# Patient Record
Sex: Male | Born: 1935 | Race: White | Hispanic: No | Smoking: Former smoker
Health system: Southern US, Community
[De-identification: ages and names within clinical notes are randomized; demographics above are authoritative.]

## PROBLEM LIST (undated history)

## (undated) DIAGNOSIS — F329 Major depressive disorder, single episode, unspecified: Secondary | ICD-10-CM

## (undated) DIAGNOSIS — E538 Deficiency of other specified B group vitamins: Secondary | ICD-10-CM

## (undated) DIAGNOSIS — I2510 Atherosclerotic heart disease of native coronary artery without angina pectoris: Secondary | ICD-10-CM

## (undated) DIAGNOSIS — F32A Depression, unspecified: Secondary | ICD-10-CM

## (undated) DIAGNOSIS — C801 Malignant (primary) neoplasm, unspecified: Secondary | ICD-10-CM

## (undated) DIAGNOSIS — M1990 Unspecified osteoarthritis, unspecified site: Secondary | ICD-10-CM

## (undated) HISTORY — PX: HERNIA REPAIR: SHX51

## (undated) HISTORY — PX: EYE SURGERY: SHX253

---

## 1998-03-16 HISTORY — PX: REVISION UROSTOMY CUTANEOUS: SUR1282

## 1998-10-07 ENCOUNTER — Encounter (INDEPENDENT_AMBULATORY_CARE_PROVIDER_SITE_OTHER): Payer: Self-pay

## 1998-10-07 ENCOUNTER — Encounter: Payer: Self-pay | Admitting: Urology

## 1998-10-07 ENCOUNTER — Observation Stay (HOSPITAL_COMMUNITY): Admission: RE | Admit: 1998-10-07 | Discharge: 1998-10-08 | Payer: Self-pay | Admitting: Urology

## 1998-11-04 ENCOUNTER — Encounter (INDEPENDENT_AMBULATORY_CARE_PROVIDER_SITE_OTHER): Payer: Self-pay | Admitting: Specialist

## 1998-11-04 ENCOUNTER — Inpatient Hospital Stay (HOSPITAL_COMMUNITY): Admission: RE | Admit: 1998-11-04 | Discharge: 1998-11-10 | Payer: Self-pay | Admitting: Urology

## 1999-01-08 ENCOUNTER — Encounter: Payer: Self-pay | Admitting: Urology

## 1999-01-08 ENCOUNTER — Encounter: Admission: RE | Admit: 1999-01-08 | Discharge: 1999-01-08 | Payer: Self-pay | Admitting: Urology

## 1999-07-02 ENCOUNTER — Encounter: Payer: Self-pay | Admitting: Urology

## 1999-07-02 ENCOUNTER — Encounter: Admission: RE | Admit: 1999-07-02 | Discharge: 1999-07-02 | Payer: Self-pay | Admitting: Urology

## 2000-10-15 ENCOUNTER — Encounter: Admission: RE | Admit: 2000-10-15 | Discharge: 2000-10-15 | Payer: Self-pay | Admitting: Urology

## 2000-10-15 ENCOUNTER — Encounter: Payer: Self-pay | Admitting: Urology

## 2000-10-18 ENCOUNTER — Encounter: Admission: RE | Admit: 2000-10-18 | Discharge: 2000-10-18 | Payer: Self-pay | Admitting: Urology

## 2000-10-18 ENCOUNTER — Encounter: Payer: Self-pay | Admitting: Urology

## 2001-10-14 ENCOUNTER — Encounter: Payer: Self-pay | Admitting: Urology

## 2001-10-14 ENCOUNTER — Encounter: Admission: RE | Admit: 2001-10-14 | Discharge: 2001-10-14 | Payer: Self-pay | Admitting: Urology

## 2001-10-20 ENCOUNTER — Encounter: Payer: Self-pay | Admitting: Urology

## 2001-10-20 ENCOUNTER — Encounter: Admission: RE | Admit: 2001-10-20 | Discharge: 2001-10-20 | Payer: Self-pay | Admitting: Urology

## 2002-05-08 ENCOUNTER — Encounter: Admission: RE | Admit: 2002-05-08 | Discharge: 2002-05-08 | Payer: Self-pay | Admitting: Urology

## 2002-05-08 ENCOUNTER — Encounter: Payer: Self-pay | Admitting: Urology

## 2002-05-10 ENCOUNTER — Ambulatory Visit (HOSPITAL_BASED_OUTPATIENT_CLINIC_OR_DEPARTMENT_OTHER): Admission: RE | Admit: 2002-05-10 | Discharge: 2002-05-10 | Payer: Self-pay | Admitting: Urology

## 2002-06-05 ENCOUNTER — Encounter (INDEPENDENT_AMBULATORY_CARE_PROVIDER_SITE_OTHER): Payer: Self-pay | Admitting: Specialist

## 2002-06-05 ENCOUNTER — Observation Stay (HOSPITAL_COMMUNITY): Admission: RE | Admit: 2002-06-05 | Discharge: 2002-06-06 | Payer: Self-pay | Admitting: Urology

## 2003-05-01 ENCOUNTER — Encounter: Admission: RE | Admit: 2003-05-01 | Discharge: 2003-05-01 | Payer: Self-pay | Admitting: Urology

## 2003-05-02 ENCOUNTER — Ambulatory Visit (HOSPITAL_BASED_OUTPATIENT_CLINIC_OR_DEPARTMENT_OTHER): Admission: RE | Admit: 2003-05-02 | Discharge: 2003-05-02 | Payer: Self-pay | Admitting: Urology

## 2003-05-02 ENCOUNTER — Encounter (INDEPENDENT_AMBULATORY_CARE_PROVIDER_SITE_OTHER): Payer: Self-pay | Admitting: *Deleted

## 2003-05-02 ENCOUNTER — Ambulatory Visit (HOSPITAL_COMMUNITY): Admission: RE | Admit: 2003-05-02 | Discharge: 2003-05-02 | Payer: Self-pay | Admitting: Urology

## 2003-05-21 ENCOUNTER — Encounter (INDEPENDENT_AMBULATORY_CARE_PROVIDER_SITE_OTHER): Payer: Self-pay | Admitting: Specialist

## 2003-05-21 ENCOUNTER — Ambulatory Visit (HOSPITAL_COMMUNITY): Admission: RE | Admit: 2003-05-21 | Discharge: 2003-05-21 | Payer: Self-pay | Admitting: Urology

## 2004-03-16 HISTORY — PX: KNEE ARTHROSCOPY: SHX127

## 2004-11-03 ENCOUNTER — Ambulatory Visit: Payer: Self-pay | Admitting: Gastroenterology

## 2007-12-01 ENCOUNTER — Ambulatory Visit: Payer: Self-pay | Admitting: Critical Care Medicine

## 2007-12-01 DIAGNOSIS — I2510 Atherosclerotic heart disease of native coronary artery without angina pectoris: Secondary | ICD-10-CM | POA: Insufficient documentation

## 2007-12-02 ENCOUNTER — Telehealth: Payer: Self-pay | Admitting: Critical Care Medicine

## 2007-12-04 DIAGNOSIS — E785 Hyperlipidemia, unspecified: Secondary | ICD-10-CM

## 2007-12-04 DIAGNOSIS — G4730 Sleep apnea, unspecified: Secondary | ICD-10-CM | POA: Insufficient documentation

## 2007-12-04 DIAGNOSIS — J449 Chronic obstructive pulmonary disease, unspecified: Secondary | ICD-10-CM

## 2007-12-04 DIAGNOSIS — J4489 Other specified chronic obstructive pulmonary disease: Secondary | ICD-10-CM | POA: Insufficient documentation

## 2007-12-08 ENCOUNTER — Ambulatory Visit: Payer: Self-pay | Admitting: Critical Care Medicine

## 2007-12-08 DIAGNOSIS — R0602 Shortness of breath: Secondary | ICD-10-CM

## 2007-12-09 ENCOUNTER — Encounter: Payer: Self-pay | Admitting: Critical Care Medicine

## 2008-01-02 ENCOUNTER — Ambulatory Visit: Payer: Self-pay | Admitting: Critical Care Medicine

## 2010-01-07 ENCOUNTER — Emergency Department: Payer: PRIVATE HEALTH INSURANCE | Admitting: Emergency Medicine

## 2010-03-16 HISTORY — PX: MOHS SURGERY: SUR867

## 2010-04-22 ENCOUNTER — Ambulatory Visit: Payer: PRIVATE HEALTH INSURANCE | Admitting: Sports Medicine

## 2010-08-01 NOTE — Op Note (Signed)
NAME:  Shawn Lyons, Shawn Lyons                         ACCOUNT NO.:  [id number]   MEDICAL RECORD NO.:  [id number]                   PATIENT TYPE:  AMB   LOCATION:  [facility]                                 FACILITY:  [facility]   PHYSICIAN:  Valetta Fuller, M.D.               DATE OF BIRTH:  06-Sep-1919   DATE OF PROCEDURE:  05/02/2003  DATE OF DISCHARGE:                                 OPERATIVE REPORT   PREOPERATIVE DIAGNOSES:  1. 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  2. Urethral discharge.   POSTOPERATIVE DIAGNOSIS:  1. 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  2. Urethral discharge.   PROCEDURE PERFORMED:  Urethroscopy with biopsy and fulguration.   SURGEON:  Valetta Fuller, M.D.   ANESTHESIA:  General.   INDICATION:  Shawn Lyons is an 77 year old male.  He had documentation of  muscle invasive bladder cancer approximately five years ago.  He had  nonmetastatic disease.  He subsequently underwent radical  cystoprostatectomy.  He did have residual carcinoma in situ of his bladder.  There was no evidence of advanced disease and he continued to do well  clinically he has had no complications or problems from surgery and follow  up imaging studies have failed to demonstrate recurrence or metastatic  disease.  Approximately a year ago he began noticing a little discomfort  within his penis.  At that time, we did washings from his urethra that  revealed some questionable transitional cell carcinoma.  On cystoscopy we  could see no definite tumor but the patient did undergo a complete  urethrectomy out to the glandular urethra and a small remnant of glandular  urethra was left.  That was approximately a year ago and the pathology  showed some dysplasia and possibly some transitional cell carcinoma in  situ,  but no definitive disease outside the urethra.  The patient recently has had  a little bloody spotting from his urethra, therefore presents now for  assessment of the last remaining distal stump of urethra.   TECHNIQUE:  The patient was brought to the operating room where he had  successful induction of general anesthesia.  He was placed in the lithotomy  position and prepped and draped in the usual manner. Endoscopy was performed  with the distal most 3-4 cm of urethra.  Right at the area of previous  resection, there was some abnormal-appearing mucosa.  It appeared to be  consistent with recurrent transitional cell carcinoma in this area.  It  appeared low grade and superficial.  Multiple cold cut biopsies were taken  and all the residual tumor was removed.  The area was then extensively  fulgurated with the Bugbee electrode.  The patient appeared to tolerate the  procedure well and there were  no obvious complications.                                              Valetta Fuller, M.D.   DSG/MEDQ  D:  05/02/2003  T:  05/02/2003  Job:  [id number]

## 2010-08-01 NOTE — Op Note (Signed)
NAME:  Shawn Lyons, Shawn Lyons                         ACCOUNT NO.:  [id number]   MEDICAL RECORD NO.:  [id number]                   PATIENT TYPE:  AMB   LOCATION:  DAY                                  FACILITY:  [facility]   PHYSICIAN:  Valetta Fuller, M.D.               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   POSTOPERATIVE DIAGNOSES:  Transitional cell carcinoma of the retained  urethra status post radical cystoprostatectomy.   PROCEDURE:  Urethrectomy.   SURGEON:  Valetta Fuller, M.D.   ASSISTANT:  Sigmund I. Patsi Sears, M.D.   ANESTHESIA:  General.   INDICATIONS FOR PROCEDURE:  Shawn Lyons is an 77 year old male. He  initially presented with multifocal transitional cell carcinoma of the  bladder showing both invasion as well as carcinoma in situ. He subsequently  had cystectomy with ileal urinary diversion about 3 1/2 years ago. His final  pathology revealed some superficial invasive adenocarcinoma of the bladder  as well as carcinoma in situ of the bladder and also some adenocarcinoma of  the prostate. Postoperatively, he has done extremely well. All imaging  studies have been negative. He recently complained of a little penile  irritation. Cytology washings of his urethra showed some malignant cells.  Cystoscopy did not reveal an obvious tumor and his CT was negative. 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 He  presents now for urethrectomy. We explained the procedure to him, the  rationale for this. He seems to understand this issue and full informed  consent was obtained.   TECHNIQUE:  The patient was brought to the operating room. He was placed in  the high lithotomy position after a successful induction of general  endotracheal  anesthesia and was prepped and draped in the usual manner. An  inverted Y incision was made in the perineum extending from the under  surface of the scrotum down around the anus. Superficial layers were incised  with electrocautery. With the sound and the urethra, we were able to  identify the more proximal urethra and the mobile spongiosa muscle was  incised in the midline. The urethra was encircled with a Penrose clamp. We  initiated our dissection distally separating the urethra from the corporal  bodies. We everted the penis and took out the urethra to the glans penis. We  did not feel in this situation it was really essential that we take out the  glandular section of the urethra and therefore the urethra was transected.  With the catheter in distally, we placed a clamp on the urethra and began  our dissection more proximally. The tissue planes were all normal until we  got to the very last aspect of the  urethra. There the tissues became quite  attenuated and with some very light traction on the urethra and some sharp  dissective technique, the entire urethra was removed. The wounds  were copiously irrigated. Small bleeders were handled with electrocautery.  All the tissue were reapproximated with some interrupted Vicryl suture and  the skin was closed with 4-0 Vicryl. A small Penrose drain was placed. The  patient appeared to tolerate the procedure well and there were no obvious  complications.                                                Valetta Fuller, M.D.    DSG/MEDQ  D:  06/05/2002  T:  06/05/2002  Job:  [id number]

## 2010-08-01 NOTE — Op Note (Signed)
NAME:  Shawn Lyons, Shawn Lyons                         ACCOUNT NO.:  [id number]   MEDICAL RECORD NO.:  [id number]                   PATIENT TYPE:  AMB   LOCATION:  [facility]                                 FACILITY:  [facility]   PHYSICIAN:  Valetta Fuller, M.D.               DATE OF BIRTH:  12-Feb-1920   DATE OF PROCEDURE:  05/10/2002  DATE OF DISCHARGE:                                 OPERATIVE REPORT   PREOPERATIVE DIAGNOSES:  1. History of transitional cell carcinoma of the bladder, status post     radical cystoprostatectomy and ileal loop urinary diversion.  2. Penile discomfort.  3. Positive urethral cytology for recurrent transitional cell carcinoma.   POSTOPERATIVE DIAGNOSES:  1. History of transitional cell carcinoma of the bladder, status post     radical cystoprostatectomy and ileal loop urinary diversion.  2. Penile discomfort.  3. Positive urethral cytology for recurrent transitional cell carcinoma.   PROCEDURE:  Cystoscopy and retrograde urethrogram.   SURGEON:  Valetta Fuller, M.D.   ANESTHESIA:  General.   INDICATIONS:  The patient is an 77 year old male.  The patient underwent a  radical cystoprostatectomy with urinary diversion a little over three years  ago.  He has done extremely well from surgery, and his pathology was  reasonably favorable.  He has had fairly regular CT scans, including  loopograms, and his last imaging in August of last year was normal.  He came  in recently for routine follow-up.  He complained of a little bit of penile  irritation and for that reason we did a urethral washing, which showed  evidence of high-grade urothelial cell carcinoma.  We felt it prudent to  perform cystoscopy and a retrograde urethrogram to get a better assessment  of the extent of this potential problem.  Because of the patient's  discomfort and fairly significant amount of pain with the cytology in the  office, we decided to go ahead and do this under anesthesia.   DESCRIPTION OF PROCEDURE:  The patient was brought to the operating room,  where he had successful induction of general anesthesia.  He was prepped and  draped in the usual manner.  Cystoscopy revealed a completely unremarkable  urethra.  As we get down to the area of the bulbar/membranous urethra, the  urethra became obliterated but I saw no evidence of obvious tumor within the  proximal urethra.  There was no really unusual inflammation.  We really  could not rule out the possibility of severe dysplasia or carcinoma in situ,  although endoscopically the mucosa looked relatively unremarkable.  Retrograde urethrogram also failed to show any filling defects.  At the  completion of the procedure, the patient was brought to the recovery room in  stable condition.  Our  plan will be to perform a pelvic CT scan.  Given his cytology, I do think it  is going to be necessary to do a  urethrectomy despite the relatively  unremarkable cystoscopy today.  We certainly want to get a CT first to be  sure that there is not more bulky disease or an obvious pelvic recurrence.                                               Valetta Fuller, M.D.    DSG/MEDQ  D:  05/10/2002  T:  05/10/2002  Job:  [id number]

## 2010-08-01 NOTE — H&P (Signed)
NAME:  Shawn Lyons, Shawn Lyons                         ACCOUNT NO.:  [id number]   MEDICAL RECORD NO.:  [id number]                   PATIENT TYPE:  AMB   LOCATION:  DAY                                  FACILITY:  [facility]   PHYSICIAN:  Valetta Fuller, M.D.               DATE OF BIRTH:  1919-07-04   DATE OF ADMISSION:  06/05/2002  DATE OF DISCHARGE:                                HISTORY & PHYSICAL   PREOPERATIVE DIAGNOSIS:  Transitional cell carcinoma of the urethra.   HISTORY OF PRESENT ILLNESS:  The patient is an 77 year old male.  He  underwent cystectomy in August 2000.  Prior to cystectomy, he had had a  pathology which had shown multiple areas of transitional cell carcinoma of  the bladder.  There was no obvious tumor within the prostatic urethra or at  the bladder neck.  He had poorly differentiated invasive transitional cell  carcinoma.  He also had carcinoma in situ.  At the time of his cystectomy he  was found to actually have some adenocarcinoma of his bladder.  He also had  some residual carcinoma in situ.  There was no muscle invasive disease noted  at that time of his cystectomy.  Again, he did have carcinoma in situ and he  also was noted incidentally to have prostatic adenocarcinoma.  The patient  did well from his surgery and has done extremely well over the last several  years.  He recently came in with some complaints of some nonspecific penile  discomfort.  We did a urethral washing which showed a high-grade  transitional cell carcinoma.  Cystoscopy did not show an obvious tumor.  CT  of the pelvis with contrast did not show evidence of obvious recurrent  tumor.  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  For that reason, I have recommended a completion urethrectomy.  The patient  presents now for that procedure and will be admitted for routine  postoperative care.   PAST MEDICAL HISTORY:  Past medical history is primarily significant for his  history of bladder cancer.  The patient's past medical history is mostly  notable for some gastroesophageal reflux for which he takes Prevacid.  He is  also on Zocor.  He has been on aspirin and Plavix as well.  He has had an MI  and had a cardiac cath in the past.  He does have a history of cardiac  stent.  He has no drug allergies.   SOCIAL HISTORY:  He is a social drinker.  He has a minimal tobacco use  history but quit in the past.   PAST SURGICAL HISTORY:  Surgical history is also notable for inguinal hernia  repair.   PHYSICAL EXAMINATION:  GENERAL:  He is a well-developed, well-nourished  male.  He appears somewhat younger than his stated age of 66.  VITAL SIGNS:  He is afebrile with a  heart rate of 80 and blood pressure  120/66.  CHEST:  Clear to auscultation.  ABDOMEN:  Abdomen is soft.  He has a healthy-appearing stoma in the right  lower quadrant.  GU:  External genitalia shows a normal penis with no abnormalities of the  scrotum, testes, or adnexal structures.  EXTREMITIES:  No edema.   DATA:  Labs are notable for a hemoglobin of 12.6, his potassium is slightly  elevated at 5.2.  His BUN and creatinine are 20 and 1.0, respectively.   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    PLAN:  The patient will have a completion urethrectomy today and will be  admitted hopefully for routine postoperative care.                                               Valetta Fuller, M.D.    DSG/MEDQ  D:  06/05/2002  T:  06/05/2002  Job:  [id number]

## 2010-08-01 NOTE — Op Note (Signed)
NAME:  Shawn Lyons, Shawn Lyons                         ACCOUNT NO.:  [id number]   MEDICAL RECORD NO.:  [id number]                   PATIENT TYPE:  OBV   LOCATION:  8295                                 FACILITY:  [facility]   PHYSICIAN:  Valetta Fuller, M.D.               DATE OF BIRTH:  [birth year]   DATE OF PROCEDURE:  05/21/2003  DATE OF DISCHARGE:                                 OPERATIVE REPORT   PREOPERATIVE DIAGNOSIS:  1. History of transitional cell carcinoma of the urinary bladder and     urethra.  2. Status post radical cystoprostatectomy with ileal loop urinary diversion     and partial urethrectomy.  3. Urethral carcinoma recurrence, distal urethra.   POSTOPERATIVE DIAGNOSIS:  1. History of transitional cell carcinoma of the urinary bladder and     urethra.  2. Status post radical cystoprostatectomy with ileal loop urinary diversion     and partial urethrectomy.  3. Urethral carcinoma recurrence, distal urethra.   PROCEDURE PERFORMED:  Penectomy.   SURGEON:  Valetta Fuller, M.D.   ANESTHESIA:  General.   INDICATIONS:  Shawn Lyons is an 77 year old male.  He has a complex  urologic history.  The patient was originally diagnosed with multifocal  transitional cell carcinoma of the urinary bladder approximately six years  ago.  He was initially under the care of a urologist in [place].  He had  several recurrences.  We saw him for a second opinion and recommended  initiation of intravesical therapy.  He failed that with recurrent  transitional cell carcinoma in situ in his bladder.  He subsequently  underwent radical cystoprostatectomy with ileal loop urinary diversion.  He  was noted to incidentally have adenocarcinoma of the prostate.  While he had  extensive disease in his bladder, he had no residual muscle or invasive  disease.  I felt he had an excellent chance of cure.  He did extremely well  for four years without evidence of recurrence.  Eventual urethral washings  showed positive cytology in his urethra.  His urethra was resected with a  small amount left distally within the glans urethra.  Pathology revealed  just dysplasia and no evidence of invasive disease.  The patient recently  had some bloody discharge from his urethra, and followup showed a recurrence  in the most distal aspect of the remaining urethra.  This tumor was high  grade and thought to be invasive.  In addition, the patient had some penile  discomfort, and we were concerned there probably was disease outside of the  urethra proper.  Patient underwent extensive counseling.  We discussed his  case in multidisciplinary oncology conference with the idea of potentially  offering either radiation or potential penectomy for this distal urethral  recurrence.  We felt that just removal of the remaining small stumpy urethra  would probably be inadequate.  After a month's discussion, the patient has  elected  to have a partial penectomy.  He has not been sexually active for  over eight years, and certainly, of course, does not void through his penis  because of his urinary diversion.  He presents now for this procedure.   TECHNIQUE/FINDINGS:  Patient was brought to the operating room where he had  successful induction of general anesthesia.  He was prepped and draped in  the usual manner.  We went ahead with a circumferential skin incision around  the base of the penis.  That skin was then degloved off the overlying Buck's  fascia of the penis.  Once we were able to expose the base/root of the  penis, we placed a tourniquet around the corporal bodies.  We knew that the  recurrence was in the stump of the urethra that was remaining.  For that  reason, I placed a red Robinson catheter through the glans penis, and it  went back about 3 cm, indicating the area where there was still some  residual urethra.  We went at least 2 cm proximal to that and transected the  corpora.  As we transected the  corpora, some stay sutures of 2-0 Vicryl were  placed.  There was no residual urethral tissue on this area, and all of the  residual urethral tissue was proximal within the specimen.  The corporal  bodies were then over-sewn with several running 2-0 Vicryl sutures.  The  skin was then closed with some interrupted 4-0 Vicryl sutures.  The patient  appeared to tolerate the procedure well.  There were no obvious  complications or problems.  He was brought to the recovery room in  satisfactory condition.  Sponge and needle counts were correct.                                               Valetta Fuller, M.D.    DSG/MEDQ  D:  05/21/2003  T:  05/21/2003  Job:  [id number]

## 2011-01-25 ENCOUNTER — Emergency Department: Payer: PRIVATE HEALTH INSURANCE | Admitting: Emergency Medicine

## 2011-09-05 LAB — COMPREHENSIVE METABOLIC PANEL
Albumin: 3.8 g/dL (ref 3.4–5.0)
Alkaline Phosphatase: 109 U/L (ref 50–136)
Anion Gap: 13 (ref 7–16)
Bilirubin,Total: 0.6 mg/dL (ref 0.2–1.0)
Chloride: 102 mmol/L (ref 98–107)
Creatinine: 0.89 mg/dL (ref 0.60–1.30)
EGFR (African American): >60
Glucose: 128 mg/dL — ABNORMAL HIGH (ref 65–99)
SGPT (ALT): 18 U/L
Total Protein: 7.2 g/dL (ref 6.4–8.2)

## 2011-09-05 LAB — CBC WITH DIFFERENTIAL/PLATELET
Basophil #: 0 10*3/uL (ref 0.0–0.1)
Basophil %: 0.3 %
Eosinophil #: 0 10*3/uL (ref 0.0–0.7)
Eosinophil %: 0.3 %
HCT: 38.3 % — ABNORMAL LOW (ref 40.0–52.0)
HGB: 12.9 g/dL — ABNORMAL LOW (ref 13.0–18.0)
Lymphocyte #: 0.6 10*3/uL — ABNORMAL LOW (ref 1.0–3.6)
Lymphocyte %: 6.5 %
MCH: 30.2 pg (ref 26.0–34.0)
MCHC: 33.6 g/dL (ref 32.0–36.0)
MCV: 90 fL (ref 80–100)
Monocyte #: 0.4 x10 3/mm (ref 0.2–1.0)
Monocyte %: 4.8 %
Neutrophil #: 8.2 10*3/uL — ABNORMAL HIGH (ref 1.4–6.5)
Neutrophil %: 88.1 %
Platelet: 173 10*3/uL (ref 150–440)
RBC: 4.27 10*6/uL — ABNORMAL LOW (ref 4.40–5.90)
RDW: 14.2 % (ref 11.5–14.5)
WBC: 9.3 10*3/uL (ref 3.8–10.6)

## 2011-09-05 LAB — URINALYSIS, COMPLETE
Bacteria: NONE SEEN
Bilirubin,UR: NEGATIVE
Glucose,UR: NEGATIVE mg/dL (ref 0–75)
Leukocyte Esterase: NEGATIVE
Nitrite: NEGATIVE
Protein: 30
RBC,UR: 42 /HPF (ref 0–5)
Specific Gravity: 1.016 (ref 1.003–1.030)
WBC UR: 43 /HPF (ref 0–5)

## 2011-09-05 LAB — LIPASE, BLOOD: Lipase: 72 U/L — ABNORMAL LOW (ref 73–393)

## 2011-09-05 LAB — AMYLASE: Amylase: 62 U/L (ref 25–115)

## 2011-09-06 ENCOUNTER — Inpatient Hospital Stay: Payer: Self-pay | Admitting: Surgery

## 2011-09-06 LAB — TROPONIN I: Troponin-I: <0.02 ng/mL

## 2011-09-07 LAB — CBC WITH DIFFERENTIAL/PLATELET
Basophil #: 0 10*3/uL (ref 0.0–0.1)
Basophil %: 0.5 %
Eosinophil #: 0.1 10*3/uL (ref 0.0–0.7)
HCT: 33.3 % — ABNORMAL LOW (ref 40.0–52.0)
Lymphocyte #: 1 10*3/uL (ref 1.0–3.6)
Lymphocyte %: 26.9 %
MCH: 30.4 pg (ref 26.0–34.0)
MCHC: 34.3 g/dL (ref 32.0–36.0)
MCV: 89 fL (ref 80–100)
Monocyte %: 12 %
Neutrophil #: 2.2 10*3/uL (ref 1.4–6.5)
Platelet: 163 10*3/uL (ref 150–440)
RDW: 14.6 % — ABNORMAL HIGH (ref 11.5–14.5)

## 2011-09-07 LAB — BASIC METABOLIC PANEL
Anion Gap: 7 (ref 7–16)
BUN: 10 mg/dL (ref 7–18)
EGFR (African American): >60
EGFR (Non-African Amer.): >60
Osmolality: 282 (ref 275–301)
Potassium: 3.7 mmol/L (ref 3.5–5.1)

## 2011-09-07 LAB — URINE CULTURE

## 2011-10-07 ENCOUNTER — Ambulatory Visit: Payer: Self-pay | Admitting: Surgery

## 2011-10-07 DIAGNOSIS — I2510 Atherosclerotic heart disease of native coronary artery without angina pectoris: Secondary | ICD-10-CM

## 2011-10-17 LAB — CREATININE, SERUM
Creatinine: 0.78 mg/dL (ref 0.60–1.30)
EGFR (African American): >60
EGFR (Non-African Amer.): >60

## 2011-10-18 ENCOUNTER — Inpatient Hospital Stay: Payer: Self-pay | Admitting: Surgery

## 2012-03-12 ENCOUNTER — Ambulatory Visit: Payer: Self-pay | Admitting: Orthopedic Surgery

## 2012-11-19 ENCOUNTER — Observation Stay: Payer: Self-pay | Admitting: Internal Medicine

## 2012-11-19 LAB — COMPREHENSIVE METABOLIC PANEL
Albumin: 3.7 g/dL (ref 3.4–5.0)
Alkaline Phosphatase: 100 U/L (ref 50–136)
BUN: 23 mg/dL — ABNORMAL HIGH (ref 7–18)
Bilirubin,Total: 0.3 mg/dL (ref 0.2–1.0)
Chloride: 109 mmol/L — ABNORMAL HIGH (ref 98–107)
Co2: 28 mmol/L (ref 21–32)
Creatinine: 0.9 mg/dL (ref 0.60–1.30)
EGFR (African American): >60
EGFR (Non-African Amer.): >60
SGOT(AST): 23 U/L (ref 15–37)
SGPT (ALT): 21 U/L (ref 12–78)
Total Protein: 6.5 g/dL (ref 6.4–8.2)

## 2012-11-19 LAB — CBC
HCT: 34.2 % — ABNORMAL LOW (ref 40.0–52.0)
MCHC: 34 g/dL (ref 32.0–36.0)
MCV: 87 fL (ref 80–100)
RBC: 3.95 10*6/uL — ABNORMAL LOW (ref 4.40–5.90)

## 2012-11-19 LAB — TROPONIN I: Troponin-I: <0.02 ng/mL

## 2012-11-19 LAB — PROTIME-INR: INR: 1.2

## 2012-11-19 LAB — PRO B NATRIURETIC PEPTIDE: B-Type Natriuretic Peptide: 290 pg/mL (ref 0–450)

## 2012-11-19 LAB — MAGNESIUM: Magnesium: 1.9 mg/dL

## 2012-11-20 LAB — BASIC METABOLIC PANEL
Anion Gap: 7 (ref 7–16)
Calcium, Total: 8.9 mg/dL (ref 8.5–10.1)
Chloride: 109 mmol/L — ABNORMAL HIGH (ref 98–107)
Creatinine: 0.79 mg/dL (ref 0.60–1.30)
EGFR (Non-African Amer.): >60
Glucose: 109 mg/dL — ABNORMAL HIGH (ref 65–99)
Osmolality: 286 (ref 275–301)
Sodium: 142 mmol/L (ref 136–145)

## 2012-11-20 LAB — LIPID PANEL
Cholesterol: 117 mg/dL (ref 0–200)
HDL Cholesterol: 42 mg/dL (ref 40–60)
Triglycerides: 117 mg/dL (ref 0–200)

## 2012-11-20 LAB — TSH: Thyroid Stimulating Horm: 3.5 u[IU]/mL

## 2012-11-20 LAB — HEMOGLOBIN A1C: Hemoglobin A1C: 5.6 % (ref 4.2–6.3)

## 2013-03-01 ENCOUNTER — Ambulatory Visit: Payer: Self-pay | Admitting: Ophthalmology

## 2013-03-29 ENCOUNTER — Ambulatory Visit: Payer: Self-pay | Admitting: Ophthalmology

## 2014-04-12 IMAGING — CT CT ABD-PELV W/ CM
1 of 3 series · 13 of 32 positions shown, 19 images · non-contrast
Comparison: none

REASON FOR EXAM: (1) LLQ tendernss; (2) LLQ tendernss iwth nausea
COMMENTS:

[Series 5: delay soft tissue · axial · delayed · 0.68mm/px · z∈[-1212,-814]mm · 13 of 157 slices shown, 19 images]
[im 12/157  soft-tissue]
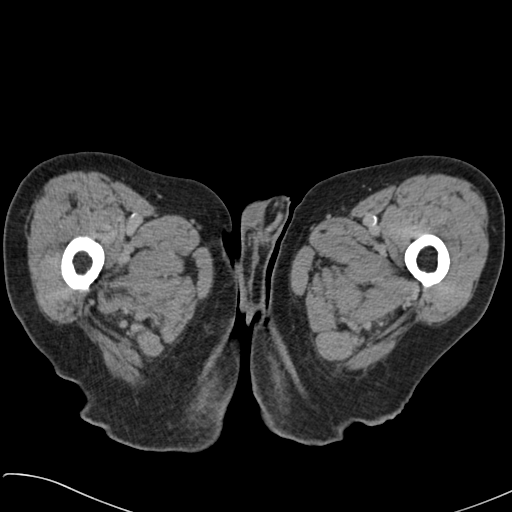
[im 12/157  bone]
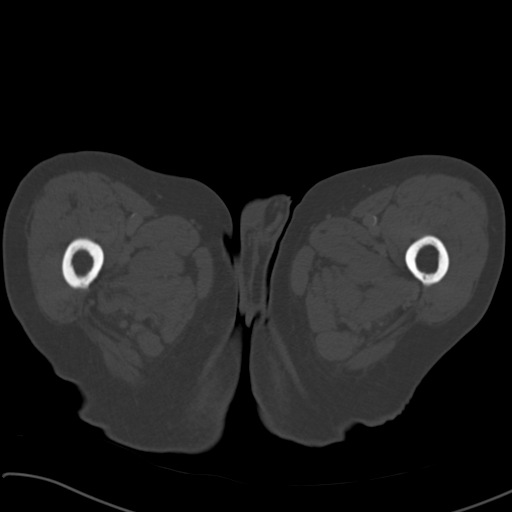
[im 23/157  soft-tissue]
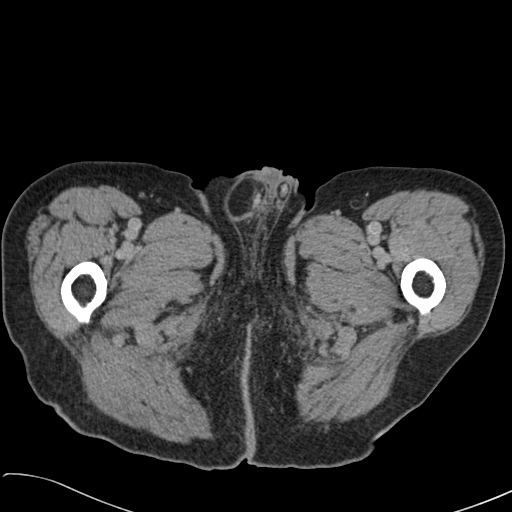
[im 34/157  soft-tissue]
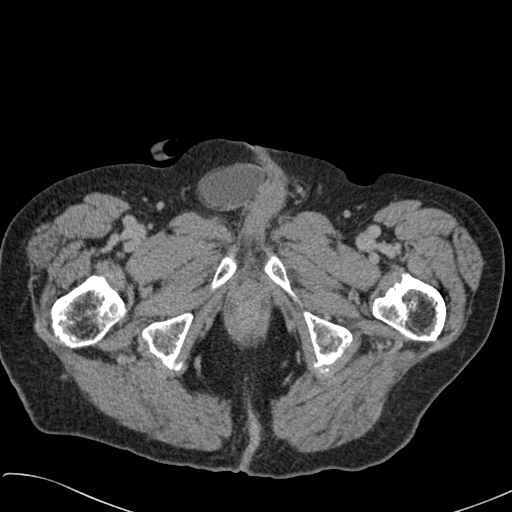
[im 45/157  soft-tissue]
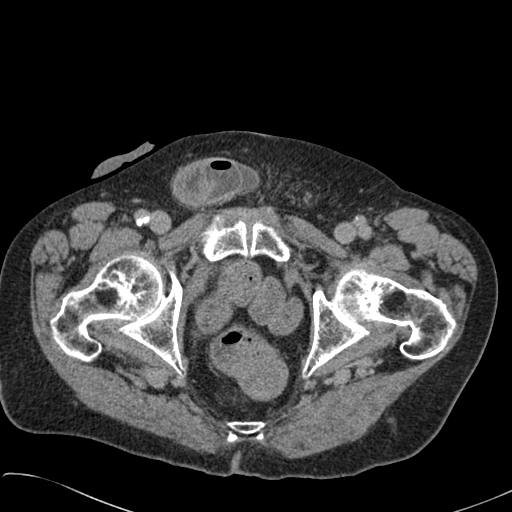
[im 56/157  soft-tissue]
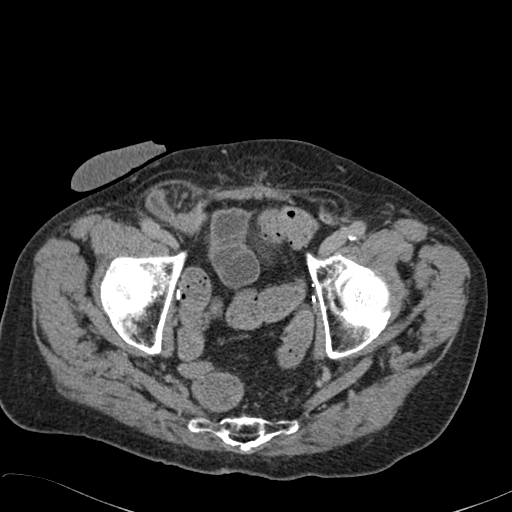
[im 67/157  soft-tissue]
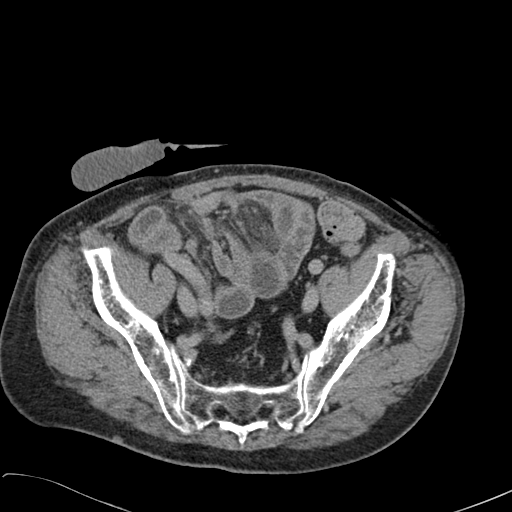
[im 79/157  soft-tissue]
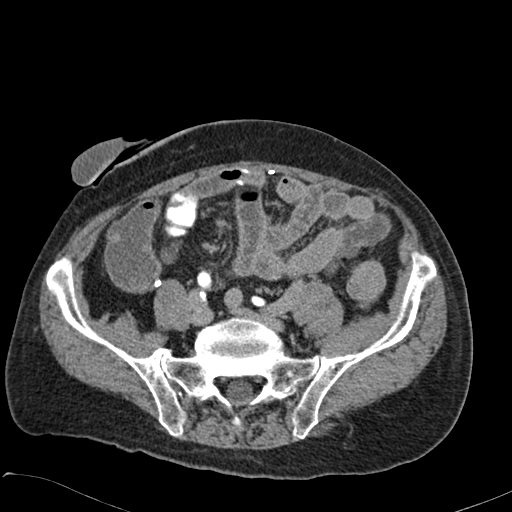
[im 90/157  soft-tissue]
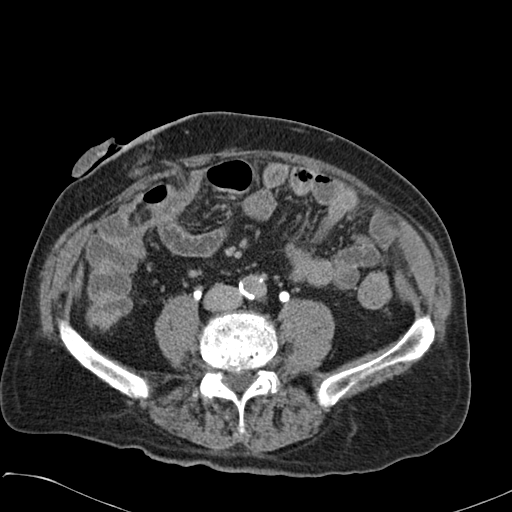
[im 101/157  soft-tissue]
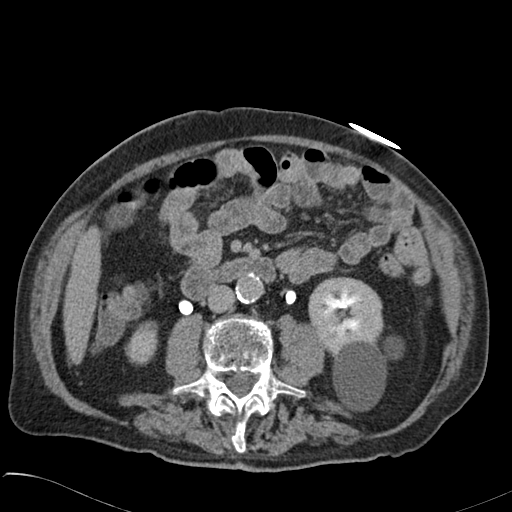
[im 101/157  bone]
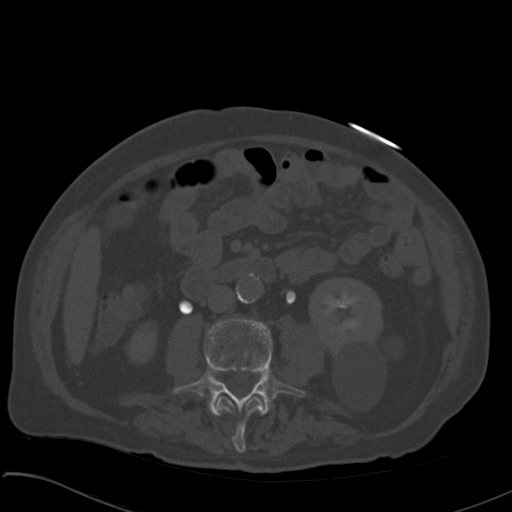
[im 112/157  soft-tissue]
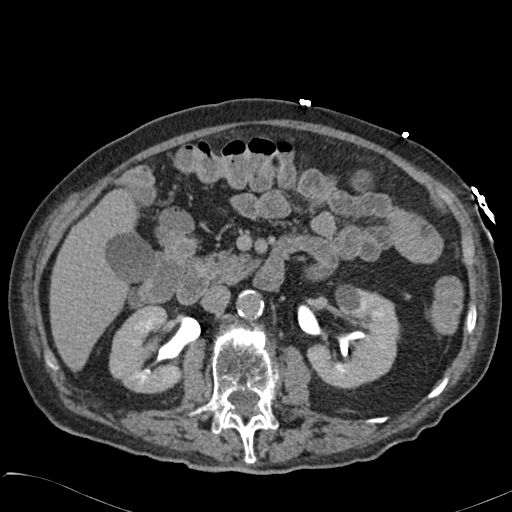
[im 112/157  lung]
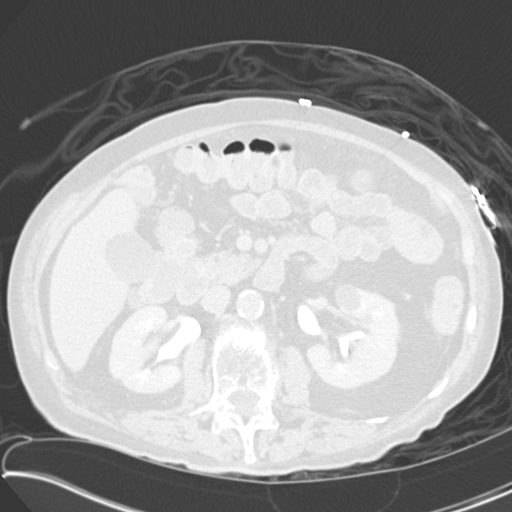
[im 123/157  soft-tissue]
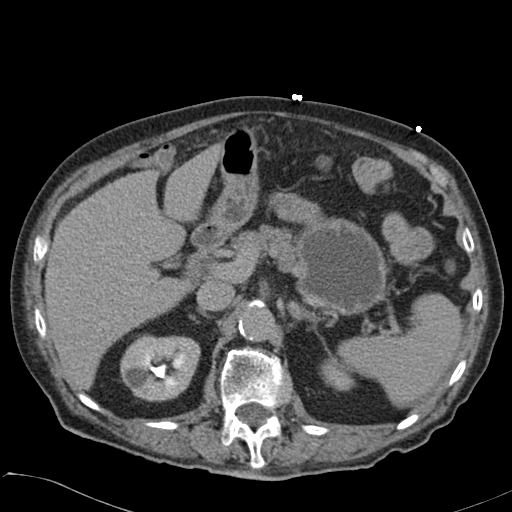
[im 123/157  lung]
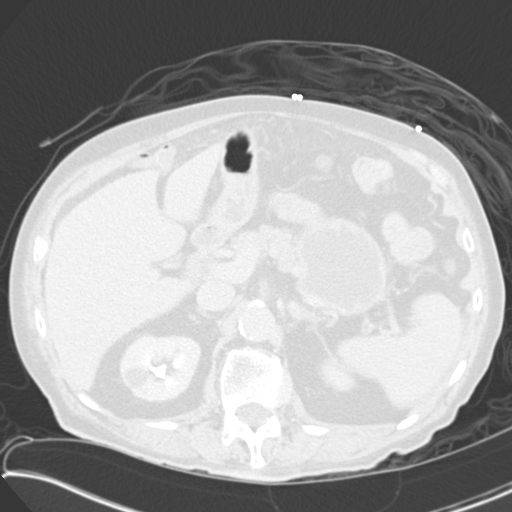
[im 134/157  soft-tissue]
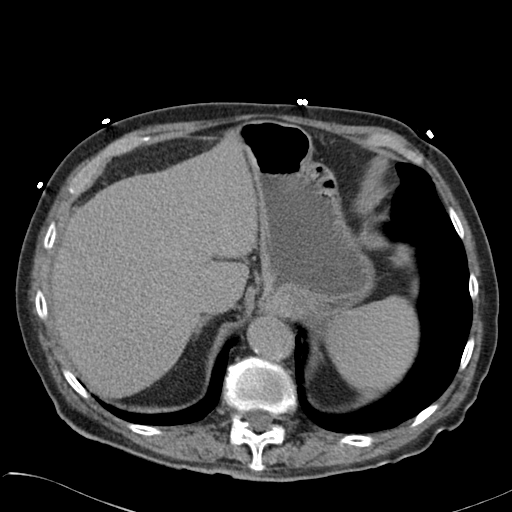
[im 134/157  lung]
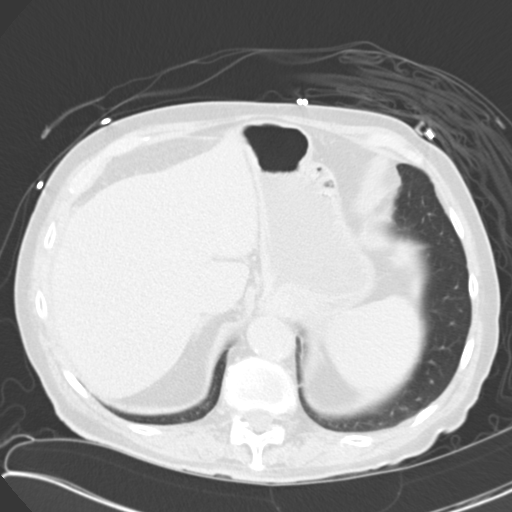
[im 145/157  soft-tissue]
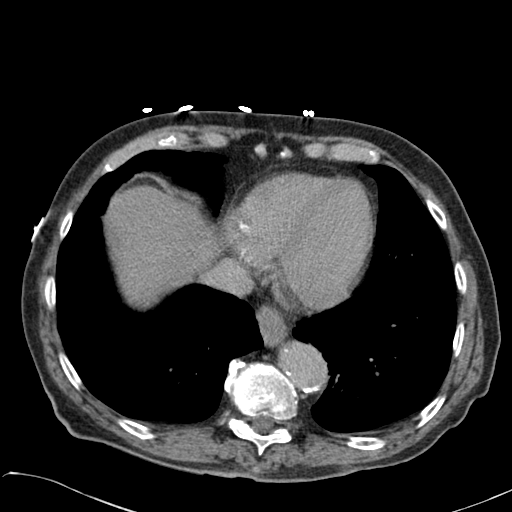
[im 145/157  lung]
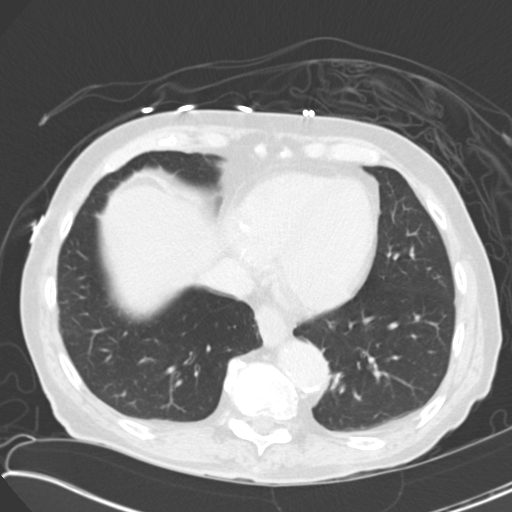

[13 of 32 positions shown; findings below may reference images not displayed]

PROCEDURE:     CT  - CT ABDOMEN / PELVIS  W  - September 05, 2011 [DATE]

RESULT:     CT of the abdomen and pelvis is performed with 100 mL of
Lsovue-U33 iodinated intravenous contrast. No oral contrast was utilized.
There is no previous exam for comparison.

The there is a loop of small bowel herniated through the inguinal canal into
the right scrotum. Mildly distended loops of small bowel are present. There
is moderate fluid distention of the stomach. The colon appears within normal
limits. Surgical consultation is recommended. Right-sided ostomy is present.
An ileal conduit appears present. Bilateral renal cysts are present. The
aorta is normal in caliber containing atherosclerotic calcification. There
is no ascites or abscess evident. The appendix is not identified.There is no
hydronephrosis or hydroureter evident. No cholelithiasis is evident. The
liver, spleen pancreas and aorta appear within normal limits except for
atherosclerotic disease.
IMPRESSION: 1. Herniation of loop of small bowel into the right inguinal canal down to
the right scrotum with slight prominence of loops of small bowel. Partial
obstruction should be considered. Surgical correlation is recommended. Ileal
conduit with an ostomy in the right abdominal wall. Atherosclerotic disease
present. Bilateral renal cysts present without obstruction. The lung bases
appear clear.

[REDACTED]

## 2014-07-03 NOTE — Op Note (Signed)
PATIENT NAME:  NEFI, MUSICH MR#:  [id number] DATE OF BIRTH:  03/02/1920  DATE OF PROCEDURE:  10/16/2011  PREOPERATIVE DIAGNOSIS: Right inguinal hernia.   POSTOPERATIVE DIAGNOSIS: Right inguinal hernia.   OPERATION: Right inguinal hernia repair.   ANESTHESIA: General.   SURGEON: Rodena Goldmann, MD  PROCEDURE: With the patient in the supine position after the induction of appropriate general anesthesia, the patient's right groin was prepped with ChloraPrep and draped with sterile towels. A parallel incision to the inguinal ligament was performed without difficulty and carried down through the subcutaneous tissue and subcutaneous fascia using Bovie electrocautery. The external oblique fascia opened through the external ring, exposing the cord structures and the sac. The cord structures were dissected off the inguinal floor and captured with a Penrose drain. The sac was identified and dissected free from the cord structures and the testicle with a large lipoma back to the internal ring. It was opened and there was no bowel in the sac. The sac was then suture ligated with 3-0 Vicryl, the sac divided and allowed to retract. The inguinal floor was then reinforced using 0 Surgilon adjoining the conjoined tendon to Poupart's ligament. The area was infiltrated with 0.5% Marcaine for postoperative pain control. 20 mL were utilized. Cord contents were returned to their anatomic position and the external oblique was closed over the cord using 3-0 Vicryl in interrupted fashion. The Scarpa's fascia was closed in a similar manner and then the skin clipped. The wounds were dressed sterilely and he was returned to the recovery room having tolerated the procedure well. Sponge, instrument, and needle counts were correct times two in the operating room.    ____________________________ Micheline Maze, MD rle:bjt D:  10/16/2011 12:22:29 ET         T: 10/16/2011 13:10:51 ET        JOB#: [id number]  cc: Ocie Cornfield.  Ouida Sills, [place] MD ELECTRONICALLY SIGNED 10/16/2011 18:42

## 2014-07-03 NOTE — Discharge Summary (Signed)
PATIENT NAME:  Shawn Lyons, Shawn Lyons MR#:  [id number] DATE OF BIRTH:  Dec 12, 1919  DATE OF ADMISSION:  10/16/2011 DATE OF DISCHARGE:  10/19/2011  ATTENDING PHYSICIAN: Dia Crawford, MD  DICTATING PHYSICIAN: Chauncey Reading, MD  DISCHARGE DIAGNOSES:  1. Right inguinal hernia, reducible. 2. History of bladder and prostate cancer, status post excessive resection requiring removal of penis and creation of urostomy. 3. Cardiac disease status post myocardial infarction with percutaneous intervention in 1990s.   PROCEDURES PERFORMED: Open right inguinal hernia repair with mesh on 10/16/2011.  HISTORY OF PRESENT ILLNESS/HOSPITAL COURSE: Shawn Lyons has a long complicated medical history requiring multiple surgeries for bladder cancer including urostomy who presented with right groin pain associated with a hernia last month.  At that time it was reduced, he was observed to ensure that we did not reduce gangrenous bowel and he was brought later back for elective surgery.  He had a relatively uncomplicated postoperative course.  His diet was slowly advanced and he was progressively advanced to PO pain medications. At the time of discharge, his pain was controlled with p.o. pain medications and he was tolerating a regular diet.  He had no difficulty with voiding or stooling.  His incision was c/d/i with staples at discharge.    DISCHARGE MEDICATIONS: 1. Aspirin 81 mg p.o. daily. 2. Remeron 30 mg by mouth daily. 3. Zocor 40 mg p.o. daily. 4. MiraLax one dose daily. 5. Pepcid 20 mg p.o. daily. 6. Unisom 25 mg p.o. at bedtime. 7. Fish oil 2,000 mg every a.m. and 1000 mg every p.m. 8. Glucosamine chondroitin with MSN oral tablet one tablet p.o. twice a day.  9. Vitamin D 5,000 units p.o. daily. 10. Norco 1 to 2 tablets p.o. every 4 hours p.r.n. pain.  DISCHARGE INSTRUCTIONS: I have informed Shawn Lyons not to lift greater than 10 pounds and not to drive on IV narcotic pain medications.Marland Kitchen He is to keep the area  clean and dry. He will make an appointment to followup with Dr. Pat Patrick in 7 to 10 days for staple removal. He is to call or return to the ED if he develops a fever of greater than 101.5, redness, or drainage from the incision, nausea/vomiting, or increased swelling. ____________________________ Glena Norfolk. Harlie Ragle, MD cal:slb D: 10/19/2011 11:52:13 ET T: 10/19/2011 12:36:24 ET JOB#: [id number]  cc: Harrell Gave A. Dhanvi Boesen, MD, <Dictator> Floyde Parkins MD ELECTRONICALLY SIGNED 10/19/2011 14:40

## 2014-07-06 NOTE — H&P (Signed)
PATIENT NAME:  Shawn Lyons, Shawn Lyons MR#:  [id number] DATE OF BIRTH:  [birth year]  DATE OF ADMISSION:  11/19/2012  PRIMARY CARE PHYSICIAN:  Dr. Frazier Richards  CHIEF COMPLAINT:  Chest pain today.   HISTORY OF PRESENT ILLNESS:  A 81 year old Caucasian male with a history of CAD, bladder and prostate cancer status post surgery, presented to the ED with chest pain today. The patient is alert, awake, oriented, in no acute distress. The patient complains of chest pain developed after breakfast, which is on the left side sharp, 8 out of 10 without radiation. Chest pain lasted about a second and then disappeared, but the patient had multiple times chest pain. He denies any palpitations, orthopnea or nocturnal dyspnea. No headache, dizziness. No leg edema. The patient denies any other symptoms.   PAST MEDICAL HISTORY:  CAD, bladder and prostate cancer.   PAST SURGICAL HISTORY: 1. Bladder and prostate cancer underwent extensive resection 2.  Urostomy.   SOCIAL HISTORY:  No smoking, alcohol drinking or illicit drugs.   FAMILY HISTORY:  Father had a heart attack.  REVIEW OF SYSTEMS:  CONSTITUTIONAL: The patient denies any fever or chills. No headache or dizziness. No weakness.  EYES:  No double vision or blurry vision.  ENT:  No postnasal drip, slurred speech or dysphagia.  CARDIOVASCULAR:  Positive for chest pain, but no palpitation, orthopnea or nocturnal dyspnea. No leg edema.  PULMONARY:  No cough, sputum, shortness of breath or hemoptysis.  GASTROINTESTINAL:  No abdominal pain, nausea, vomiting or diarrhea. No melena or bloody stool.  GENITOURINARY:  No dysuria, hematuria or incontinence.  SKIN:  No rash or jaundice.  NEUROLOGY:  No syncope, loss of consciousness or seizure. HEMATOLOGIC:  No easy bruising or bleeding.   ALLERGIES:  PENICILLIN.   HOME MEDICATIONS: 1.  Vitamin D3 5000 international units 1 cap once a day. 2.  Unisom 25 mg p.o. once a day at bedtime.  3.  Zocor 40 mg p.o.  daily at bedtime. 4.  Mirtazapine 30 mg p.o. daily at bedtime.  5.  MiraLAX 1 dose once a day.  6.  omeprezole 20 mg p.o. in the morning.  7.  Aspirin 81 mg p.o. daily at bedtime.   PHYSICAL EXAMINATION: VITAL SIGNS:  Temperature 97.6, blood pressure 178/75, pulse 57, oxygen saturation 97% on room air.  GENERAL:  The patient is alert, awake, oriented, in no acute distress.  HEENT:  Pupils round, equal and reactive to light and accommodation Moist oral mucosa. Clear oropharynx.  NECK:  Supple. No JVD or carotid bruits are noted. No lymphadenopathy. No thyromegaly.  CARDIOVASCULAR:  S1, S2 regular rate and rhythm. No murmurs, gallops.  PULMONARY:  Bilateral air entry. No wheezing or rales. No use of accessory muscles to breathe.  ABDOMEN:  Soft. No distention or tenderness. No organomegaly. Bowel sounds present.  EXTREMITIES: No edema, clubbing or cyanosis. No calf tenderness. Strong bilateral pedal pulses.  SKIN:  No rash or jaundice.  NEUROLOGIC: AO x 3. No focal deficit. Power 5/5. Sensation intact.   LABORATORY DATA:  Chest x-ray:  No acute cardiopulmonary disease.   PTT 39.2, calcium 91, glucose 92, BUN 23, creatinine 0.9. Electrolytes are normal. WBC is 5.5, hemoglobin 11.6, platelets 185, lipase 130, INR 1.2, troponin less than 0.02. BNP 290.  EKG showed normal sinus rhythm at 61 BPM.   IMPRESSION: 1.  Chest pain, atypical 2.  Hypertension.  3.  Dehydration.  4.  Coronary artery disease.  5.  Anemia.   PLAN OF  TREATMENT: 1.  The patient will be placed for observation. We will continue telemonitor, continue aspirin and Zocor. Start Lopressor and follow up troponin level.  2.  For dehydration, we will give normal saline, gentle rehydration and follow up BMP. We will check lipid panel. 3.  Gastrointestinal and deep vein thrombosis prophylaxis.  4.  I discussed the patient's condition and plan of treatment with the patient and the patient's grandson.   TIME SPENT:  About 50  minutes.  CODE STATUS: DNR.    ____________________________ Demetrios Loll, MD qc:ce D: 11/19/2012 14:23:25 ET T: 11/19/2012 14:33:56 ET JOB#: [id number]  cc: Demetrios Loll, MD, <Dictator> Demetrios Loll MD ELECTRONICALLY SIGNED 11/19/2012 15:12

## 2014-07-08 NOTE — H&P (Signed)
PATIENT NAME:  Shawn, Lyons MR#:  [id number] DATE OF BIRTH:  12-14-1919  DATE OF ADMISSION:  09/06/2011  PRIMARY CARE PHYSICIAN: Dr. Frazier Richards ADMITTING PHYSICIAN: Dr. Pat Patrick  CHIEF COMPLAINT: Abdominal pain.   BRIEF HISTORY: Shawn Lyons is a 81 year old gentleman seen in the Emergency Room with a 36 hour history of generalized abdominal pain. Pain came on suddenly yesterday primarily in the periumbilical area radiating to both lower quadrants and both upper quadrants. He could not specifically point to any area that was more uncomfortable. He had some mild nausea and dry heaves but did not vomit. He had two bowel movements yesterday, had a bowel movement this morning and had two bowel movements after arriving in the Emergency Room this evening. His family notes that he had a similar episode last weekend which came on suddenly in a similar manner and lasted approximately nine hours. Resolved spontaneously without any intervention. He has had two previous episodes within the last month both of which have resolved spontaneously. This current episode was the longer lasting episode.    He cannot relate the discomfort to any activity or position. He cannot relate it to eating. He has had no fever or chills. He has had no diarrhea. His bowel function has been good throughout the episode and he is passing gas regularly.   His surgical history is complicated. He had bladder and prostate cancer, underwent an extensive resection including removal of his penis. He had urostomy placed for reconstitution of his bladder. That procedure was performed in the late 66s at [facility] with the [facility] urology group. Denies any history of hepatitis, yellow jaundice, pancreatitis, peptic ulcer disease, gallbladder disease, or diverticulitis. He has known cardiac disease, having had a myocardial infarction in the late 90s treated with percutaneous intervention at [facility]. He has had some mild  chest pain over the last several days but has no exertional dyspnea or orthopnea. No history of congestive heart failure. He is regularly followed by Dr. Frazier Richards at [facility]. He does not take any medications regularly. He is not on aspirin. He does take MiraLax for regular bowel function.   ALLERGIES: He is not allergic to any medications.   SOCIAL HISTORY: He is not a cigarette smoker, does not drink any alcohol. He is retired and living with his daughter. The daughter accompanies him this evening.   REVIEW OF SYSTEMS: Largely unremarkable. He does not have any significant visual or hearing loss. He has not had any swallowing difficulties. He has not had any wheezing or cough. He does have some mild shortness of breath with exertion. He has no cardiac palpitations. As noted he has had some chest pain. Denies any other significant abdominal symptoms than those noted above. His genitourinary symptoms of course are restricted by his urostomy. He denies any significant lower extremity symptoms. He denies any psychiatric problems with depression or anxiety.   PHYSICAL EXAMINATION:  GENERAL: He is an alert, comfortable, pleasant gentleman.   VITAL SIGNS: Blood pressure 128/64, heart rate 84 and regular. He is afebrile. Oxygen saturation 97% on room air.   HEENT: No scleral icterus and no facial deformities. He has normal and equally round pupils.   NECK: Supple without adenopathy. Trachea is midline. Cannot palpate his thyroid gland.   CHEST: Clear bilaterally with no adventitious sounds and he has normal pulmonary excursion.   CARDIAC: 3/6 systolic murmur with no gallop rhythms and I do not hear any dysrhythmia.   ABDOMEN: Generally soft  with some generalized tenderness. He does not have any point tenderness. Does have right lower quadrant ostomy. He has a large right inguinal hernia which is tender but easily reducible. His penis is surgically absent.   EXTREMITIES: Lower  extremity exam reveals full range of motion, good distal pulses and no deformities.   PSYCHIATRIC: With normal orientation, normal affect.   IMPRESSION: I have independently reviewed his CT scan. The reading suggests there is some mildly dilated loops of small bowel but he really does not have much in the way of impressive small bowel findings. He does have a large hernia which is easily reducible. Does not show any evidence of obstruction. He does have gas and stool in his colon. No other significant abdominal findings are noted. There is no contrast in the GI tract. It is difficult to mention whether or not there is evidence of obstruction.   The surgical service was consulted with regard to possible bowel obstruction. He certainly does not have an incarcerated hernia that was easily reduced, however, his hernia is quite tender and may be responsible for some of his abdominal pain. Will plan to admit him to the hospital, treat with pain medication symptomatically. With his multiple episodes I think we cannot expect to send him home and anticipate that he will continue to improve. Will repeat a CT scan with contrast. This plan has been discussed with the patient and his daughter in detail. They are in agreement.   ____________________________ Micheline Maze, MD rle:cms D: 09/06/2011 00:16:21 ET T: 09/06/2011 09:02:16 ET JOB#: [id number]  cc: Micheline Maze, MD, <Dictator> Ocie Cornfield. Ouida Sills, MD Rodena Goldmann MD ELECTRONICALLY SIGNED 09/06/2011 21:35

## 2014-07-08 NOTE — Discharge Summary (Signed)
PATIENT NAME:  Shawn Lyons, Shawn Lyons MR#:  [id number] DATE OF BIRTH:  Mar 26, 1919  DATE OF ADMISSION:  09/06/2011 DATE OF DISCHARGE:  09/07/2011   DIAGNOSES: 1. Right groin pain. 2. Right inguinal hernia. 3. Bladder cancer.  PROCEDURES: None.   HISTORY OF PRESENT ILLNESS AND HOSPITAL COURSE: This is a patient with a long history of multiple surgeries for bladder cancer including a urostomy who presents with right groin pain. He was admitted to the hospital with a partially reducible right inguinal hernia. Dr. Pat Patrick was able to reduce most of this and talked to him concerning surgery. He has been adamant about trying to avoid surgery and currently his pain is completely resolved. His inguinal hernia is now completely reducible and he's tolerating a regular diet.   I have demonstrated to him how to reduce his hernia in the supine position and do that every morning and place a right groin truss. A prescription has been given for that. He then is to go about his normal daily activities. If it pops out at any time he can replace it by reduction as instructed and demonstrated. He will follow-up in our office as needed in two weeks or if he resumes any kind of activity that causes him pain he can call us at any time.   ____________________________ Jerrol Banana Burt Knack, MD rec:drc D: 09/07/2011 09:24:29 ET T: 09/07/2011 10:21:02 ET JOB#: [id number]  cc: Jerrol Banana. Burt Knack, MD, <Dictator> Florene Glen MD ELECTRONICALLY SIGNED 09/08/2011 7:12

## 2014-07-25 ENCOUNTER — Emergency Department
Admission: EM | Admit: 2014-07-25 | Discharge: 2014-07-25 | Disposition: A | Discharge status: Home / Self Care | Payer: Medicare Other | Attending: Emergency Medicine | Admitting: Emergency Medicine

## 2014-07-25 ENCOUNTER — Encounter: Payer: Self-pay | Admitting: *Deleted

## 2014-07-25 ENCOUNTER — Emergency Department: Payer: Medicare Other

## 2014-07-25 DIAGNOSIS — S0993XA Unspecified injury of face, initial encounter: Secondary | ICD-10-CM | POA: Diagnosis present

## 2014-07-25 DIAGNOSIS — S4992XA Unspecified injury of left shoulder and upper arm, initial encounter: Secondary | ICD-10-CM | POA: Diagnosis not present

## 2014-07-25 DIAGNOSIS — W010XXA Fall on same level from slipping, tripping and stumbling without subsequent striking against object, initial encounter: Secondary | ICD-10-CM | POA: Insufficient documentation

## 2014-07-25 DIAGNOSIS — Y9389 Activity, other specified: Secondary | ICD-10-CM | POA: Diagnosis not present

## 2014-07-25 DIAGNOSIS — Y998 Other external cause status: Secondary | ICD-10-CM | POA: Diagnosis not present

## 2014-07-25 DIAGNOSIS — S0101XA Laceration without foreign body of scalp, initial encounter: Secondary | ICD-10-CM | POA: Diagnosis not present

## 2014-07-25 DIAGNOSIS — Y9289 Other specified places as the place of occurrence of the external cause: Secondary | ICD-10-CM | POA: Insufficient documentation

## 2014-07-25 DIAGNOSIS — I6782 Cerebral ischemia: Secondary | ICD-10-CM | POA: Diagnosis not present

## 2014-07-25 HISTORY — DX: Deficiency of other specified B group vitamins: E53.8

## 2014-07-25 HISTORY — DX: Unspecified osteoarthritis, unspecified site: M19.90

## 2014-07-25 HISTORY — DX: Atherosclerotic heart disease of native coronary artery without angina pectoris: I25.10

## 2014-07-25 HISTORY — DX: Major depressive disorder, single episode, unspecified: F32.9

## 2014-07-25 HISTORY — DX: Depression, unspecified: F32.A

## 2014-07-25 MED ORDER — LIDOCAINE-EPINEPHRINE (PF) 1 %-1:200000 IJ SOLN
INTRAMUSCULAR | Status: AC
  Filled 2014-07-25: qty 30

## 2014-07-25 MED ORDER — LIDOCAINE-EPINEPHRINE 2 %-1:100000 IJ SOLN
20.0000 mL | Freq: Once | INTRAMUSCULAR | Status: DC
  Filled 2014-07-25: qty 20

## 2014-07-25 NOTE — ED Notes (Signed)
Pt with laceration to right side of forehead.  Lac approximately 8 cm in length.  Bleeding controlled with dressing in place.

## 2014-07-25 NOTE — ED Provider Notes (Signed)
[facility] Emergency Department Provider Note  ____________________________________________  Time seen: 7:25 AM  I have reviewed the triage vital signs and the nursing notes.   HISTORY  Chief Complaint Fall; Facial Laceration; and Shoulder Injury    HPI Shawn Lyons is a 81 y.o. male who woke up in his usual state of health this morning ambulate to the bathroom without assistance, as he usually does, but then feels like he tripped over something and suddenly fell forward. He notes pain in the left shoulder, particularly with movement, and laceration to his forehead. Denies any headache, neck pain, loss of consciousness to where he vision, chest pain, shortness of breath, abdominal pain, back pain, nausea, vomiting, diarrhea.  Shoulder pain is achy, located about the left proximal humerus, nonradiating, worse with movement   Past Medical History  Diagnosis Date  . Coronary artery disease   . Arthritis   . Depression   . Vitamin B12 deficiency     Patient Active Problem List   Diagnosis Date Noted  . DYSPNEA 12/08/2007  . HYPERLIPIDEMIA 12/04/2007  . CHRONIC OBSTRUCTIVE ASTHMA UNSPECIFIED 12/04/2007  . SLEEP APNEA 12/04/2007  . CORONARY HEART DISEASE 12/01/2007    No past surgical history on file.  No current outpatient prescriptions on file.  Allergies Augmentin and Prilosec  No family history on file.  Social History History  Substance Use Topics  . Smoking status: Never Smoker   . Smokeless tobacco: Never Used  . Alcohol Use: No    Review of Systems  Constitutional: No fever or chills. No weight changes Eyes:No blurry vision or double vision.  ENT: No sore throat. Cardiovascular: No chest pain. Respiratory: No dyspnea or cough. Gastrointestinal: Negative for abdominal pain, vomiting and diarrhea.  No BRBPR or melena. Genitourinary: Negative for dysuria, urinary retention, bloody urine, or difficulty  urinating. Musculoskeletal: Left shoulder pain  Skin: Negative for rash. Neurological: Negative for headaches, focal weakness or numbness. Psychiatric:No anxiety or depression.   Endocrine:No hot/cold intolerance, changes in energy, or sleep difficulty.  10-point ROS otherwise negative.  ____________________________________________   PHYSICAL EXAM:  VITAL SIGNS: ED Triage Vitals  Enc Vitals Group     BP 07/25/14 0727 133/77 mmHg     Pulse Rate 07/25/14 0727 64     Resp --      Temp 07/25/14 0727 97.8 F (36.6 C)     Temp Source 07/25/14 0727 Oral     SpO2 07/25/14 0727 99 %     Weight 07/25/14 0727 143 lb (64.864 kg)     Height 07/25/14 0727 5\' 6"  (1.676 m)     Head Cir --      Peak Flow --      Pain Score 07/25/14 0729 4     Pain Loc --      Pain Edu? --      Excl. in [place]? --      Constitutional: Alert and oriented. Well appearing and in no distress. Eyes: No scleral icterus. No conjunctival pallor. PERRL. EOMI ENT   Head: Normocephalic and atraumatic.   Nose: No congestion/rhinnorhea. No septal hematoma   Mouth/Throat: MMM, no pharyngeal erythema   Neck: No stridor. No SubQ emphysema.  Hematological/Lymphatic/Immunilogical: No cervical lymphadenopathy. Cardiovascular: RRR. Normal and symmetric distal pulses are present in all extremities. No murmurs, rubs, or gallops. Respiratory: Normal respiratory effort without tachypnea nor retractions. Breath sounds are clear and equal bilaterally. No wheezes/rales/rhonchi. Gastrointestinal: Soft and nontender. No distention. There is no CVA tenderness.  No rebound, rigidity, or guarding. Genitourinary: deferred Musculoskeletal: No edema. Left proximal humerus tenderness with pain on range of motion of the left shoulder. There also seems to be pain with stress of the proximal humerus from more distally. Right shoulder and all other joints are unremarkable. Clavicles are stable. Chest wall is nontender. C-spine is  clinically clear Neurologic:   Normal speech and language.  CN 2-10 normal. Motor grossly intact. No pronator drift.  Normal gait. No gross focal neurologic deficits are appreciated.  Skin:  Skin is warm, dry and intact. No rash noted.  No petechiae, purpura, or bullae. Psychiatric: Mood and affect are normal. Speech and behavior are normal. Patient exhibits appropriate insight and judgment.  ____________________________________________    LABS (pertinent positives/negatives) (all labs ordered are listed, but only abnormal results are displayed) Labs Reviewed - No data to display ____________________________________________   EKG    ____________________________________________    RADIOLOGY  X-ray left humerus and CT head unremarkable  ____________________________________________   PROCEDURES LACERATION REPAIR Performed by: Joni Fears, [place] by: Carrie Mew Consent: Verbal consent obtained. Risks and benefits: risks, benefits and alternatives were discussed Consent given by: patient Patient identity confirmed: provided demographic data Prepped and Draped in normal sterile fashion Wound explored  Laceration Location: Right forehead  Laceration Length: 8 cm  No Foreign Bodies seen or palpated  Anesthesia: local infiltration  Local anesthetic: lidocaine 2 % with epinephrine  Anesthetic total: 4 ml  Irrigation method: syringe Amount of cleaning: standard  Skin closure: 1 layer Ethilon 4-0   Number of sutures: 8   Technique: Running   Patient tolerance: Patient tolerated the procedure well with no immediate complications. ____________________________________________   INITIAL IMPRESSION / ASSESSMENT AND PLAN / ED COURSE  Pertinent labs & imaging results that were available during my care of the patient were reviewed by me and considered in my medical decision making (see chart for details).  Patient presents with a mechanical fall.  There is no evidence of any underlying acute medical pathology causing him to fall. There is no syncope. He sustained a contusion to the left shoulder as well as a laceration to the forehead. We'll x-ray the left humerus to rule out a humerus fracture, and we'll CT the head to ensure there is no intracranial hemorrhage. His tetanus shot is up-to-date, as he last had it in 2012, will plan to do a laceration repair  ----------------------------------------- 8:57 AM on 07/25/2014 -----------------------------------------  Radiology unremarkable, laceration, repaired. Wound care provided. Copious irrigation and wound explored without any retained foreign body or other injuries. The skull surface is visible and within the wound easily and there is no visible fracture.  ____________________________________________   FINAL CLINICAL IMPRESSION(S) / ED DIAGNOSES  Final diagnoses:  None   mechanical fall Mild traumatic head injury Scalp laceration    Carrie Mew, MD 07/25/14 [id number]

## 2014-07-25 NOTE — Discharge Instructions (Signed)
Head Injury You have a head injury. Headaches and throwing up (vomiting) are common after a head injury. It should be easy to wake up from sleeping. Sometimes you must stay in the hospital. Most problems happen within the first 24 hours. Side effects may occur up to 7-10 days after the injury.  WHAT ARE THE TYPES OF HEAD INJURIES? Head injuries can be as minor as a bump. Some head injuries can be more severe. More severe head injuries include:  A jarring injury to the brain (concussion).  A bruise of the brain (contusion). This mean there is bleeding in the brain that can cause swelling.  A cracked skull (skull fracture).  Bleeding in the brain that collects, clots, and forms a bump (hematoma). WHEN SHOULD I GET HELP RIGHT AWAY?   You are confused or sleepy.  You cannot be woken up.  You feel sick to your stomach (nauseous) or keep throwing up (vomiting).  Your dizziness or unsteadiness is getting worse.  You have very bad, lasting headaches that are not helped by medicine. Take medicines only as told by your doctor.  You cannot use your arms or legs like normal.  You cannot walk.  You notice changes in the black spots in the center of the colored part of your eye (pupil).  You have clear or bloody fluid coming from your nose or ears.  You have trouble seeing. During the next 24 hours after the injury, you must stay with someone who can watch you. This person should get help right away (call 911 in the [place] if you start to shake and are not able to control it (have seizures), you pass out, or you are unable to wake up. HOW CAN I PREVENT A HEAD INJURY IN THE FUTURE?  Wear seat belts.  Wear a helmet while bike riding and playing sports like football.  Stay away from dangerous activities around the house. WHEN CAN I RETURN TO NORMAL ACTIVITIES AND ATHLETICS? See your doctor before doing these activities. You should not do normal activities or play contact sports until 1 week  after the following symptoms have stopped:  Headache that does not go away.  Dizziness.  Poor attention.  Confusion.  Memory problems.  Sickness to your stomach or throwing up.  Tiredness.  Fussiness.  Bothered by bright lights or loud noises.  Anxiousness or depression.  Restless sleep. MAKE SURE YOU:   Understand these instructions.  Will watch your condition.  Will get help right away if you are not doing well or get worse. Document Released: 02/13/2008 Document Revised: 07/17/2013 Document Reviewed: 11/07/2012 [facility] Patient Information 2015 [place], [place]. This information is not intended to replace advice given to you by your health care provider. Make sure you discuss any questions you have with your health care provider.  Laceration Care, Adult A laceration is a cut that goes through all layers of the skin. The cut goes into the tissue beneath the skin. HOME CARE For stitches (sutures) or staples:  Keep the cut clean and dry.  If you have a bandage (dressing), change it at least once a day. Change the bandage if it gets wet or dirty, or as told by your doctor.  Wash the cut with soap and water 2 times a day. Rinse the cut with water. Pat it dry with a clean towel.  Put a thin layer of medicated cream on the cut as told by your doctor.  You may shower after the first 24 hours. Do  not soak the cut in water until the stitches are removed.  Only take medicines as told by your doctor.  Have your stitches or staples removed as told by your doctor. For skin adhesive strips:  Keep the cut clean and dry.  Do not get the strips wet. You may take a bath, but be careful to keep the cut dry.  If the cut gets wet, pat it dry with a clean towel.  The strips will fall off on their own. Do not remove the strips that are still stuck to the cut. For wound glue:  You may shower or take baths. Do not soak or scrub the cut. Do not swim. Avoid heavy sweating until  the glue falls off on its own. After a shower or bath, pat the cut dry with a clean towel.  Do not put medicine on your cut until the glue falls off.  If you have a bandage, do not put tape over the glue.  Avoid lots of sunlight or tanning lamps until the glue falls off. Put sunscreen on the cut for the first year to reduce your scar.  The glue will fall off on its own. Do not pick at the glue. You may need a tetanus shot if:  You cannot remember when you had your last tetanus shot.  You have never had a tetanus shot. If you need a tetanus shot and you choose not to have one, you may get tetanus. Sickness from tetanus can be serious. GET HELP RIGHT AWAY IF:   Your pain does not get better with medicine.  Your arm, hand, leg, or foot loses feeling (numbness) or changes color.  Your cut is bleeding.  Your joint feels weak, or you cannot use your joint.  You have painful lumps on your body.  Your cut is red, puffy (swollen), or painful.  You have a red line on the skin near the cut.  You have yellowish-white fluid (pus) coming from the cut.  You have a fever.  You have a bad smell coming from the cut or bandage.  Your cut breaks open before or after stitches are removed.  You notice something coming out of the cut, such as wood or glass.  You cannot move a finger or toe. MAKE SURE YOU:   Understand these instructions.  Will watch your condition.  Will get help right away if you are not doing well or get worse. Document Released: 08/19/2007 Document Revised: 05/25/2011 Document Reviewed: 08/26/2010 [facility] Patient Information 2015 [place], [place]. This information is not intended to replace advice given to you by your health care provider. Make sure you discuss any questions you have with your health care provider.  Stitches, Staples, or Skin Adhesive Strips  Stitches (sutures), staples, and skin adhesive strips hold the skin together as it heals. They will usually  be in place for 7 days or less. HOME CARE  Wash your hands with soap and water before and after you touch your wound.  Only take medicine as told by your doctor.  Cover your wound only if your doctor told you to. Otherwise, leave it open to air.  Do not get your stitches wet or dirty. If they get dirty, dab them gently with a clean washcloth. Wet the washcloth with soapy water. Do not rub. Pat them dry gently.  Do not put medicine or medicated cream on your stitches unless your doctor told you to.  Do not take out your own stitches or staples.  Skin adhesive strips will fall off by themselves.  Do not pick at the wound. Picking can cause an infection.  Do not miss your follow-up appointment.  If you have problems or questions, call your doctor. GET HELP RIGHT AWAY IF:   You have a temperature by mouth above 102 F (38.9 C), not controlled by medicine.  You have chills.  You have redness or pain around your stitches.  There is puffiness (swelling) around your stitches.  You notice fluid (drainage) from your stitches.  There is a bad smell coming from your wound. MAKE SURE YOU:  Understand these instructions.  Will watch your condition.  Will get help if you are not doing well or get worse. Document Released: 12/28/2008 Document Revised: 05/25/2011 Document Reviewed: 12/28/2008 [facility] Patient Information 2015 [place], [place]. This information is not intended to replace advice given to you by your health care provider. Make sure you discuss any questions you have with your health care provider.   The sutures should be removed in about one week. Follow-up with your primary care doctor or return to the ER for wound reevaluation and suture removal in one week

## 2014-07-25 NOTE — ED Notes (Signed)
Pt here after having a fall at [facility].  Pt advises he was going to [facility] and lost his balance.  Pt fell injuring left shoulder and having a large lac to forehead.  Pt GCS 15 and remembers all events surrounding this am.

## 2014-08-12 ENCOUNTER — Emergency Department
Admission: EM | Admit: 2014-08-12 | Discharge: 2014-08-12 | Disposition: A | Discharge status: Home / Self Care | Payer: Medicare Other | Attending: Emergency Medicine | Admitting: Emergency Medicine

## 2014-08-12 ENCOUNTER — Encounter: Payer: Self-pay | Admitting: Emergency Medicine

## 2014-08-12 DIAGNOSIS — Z792 Long term (current) use of antibiotics: Secondary | ICD-10-CM | POA: Insufficient documentation

## 2014-08-12 DIAGNOSIS — L02411 Cutaneous abscess of right axilla: Secondary | ICD-10-CM | POA: Insufficient documentation

## 2014-08-12 DIAGNOSIS — Z7982 Long term (current) use of aspirin: Secondary | ICD-10-CM | POA: Insufficient documentation

## 2014-08-12 DIAGNOSIS — Z880 Allergy status to penicillin: Secondary | ICD-10-CM | POA: Insufficient documentation

## 2014-08-12 DIAGNOSIS — Z79899 Other long term (current) drug therapy: Secondary | ICD-10-CM | POA: Insufficient documentation

## 2014-08-12 DIAGNOSIS — L723 Sebaceous cyst: Secondary | ICD-10-CM | POA: Insufficient documentation

## 2014-08-12 DIAGNOSIS — L0291 Cutaneous abscess, unspecified: Secondary | ICD-10-CM

## 2014-08-12 LAB — CBC WITH DIFFERENTIAL/PLATELET
Basophils Absolute: 0 10*3/uL (ref 0–0.1)
Basophils Relative: 1 %
EOS ABS: 0.3 10*3/uL (ref 0–0.7)
Eosinophils Relative: 4 %
HCT: 31.2 % — ABNORMAL LOW (ref 40.0–52.0)
Hemoglobin: 10.3 g/dL — ABNORMAL LOW (ref 13.0–18.0)
LYMPHS PCT: 21 %
Lymphs Abs: 1.4 10*3/uL (ref 1.0–3.6)
MCH: 28.9 pg (ref 26.0–34.0)
MCHC: 33.1 g/dL (ref 32.0–36.0)
MCV: 87.4 fL (ref 80.0–100.0)
Monocytes Absolute: 0.8 10*3/uL (ref 0.2–1.0)
Monocytes Relative: 12 %
NEUTROS ABS: 4.2 10*3/uL (ref 1.4–6.5)
NEUTROS PCT: 62 %
Platelets: 254 10*3/uL (ref 150–440)
RBC: 3.57 MIL/uL — AB (ref 4.40–5.90)
RDW: 14.6 % — ABNORMAL HIGH (ref 11.5–14.5)
WBC: 6.7 10*3/uL (ref 3.8–10.6)

## 2014-08-12 LAB — COMPREHENSIVE METABOLIC PANEL
ALT: 15 U/L — ABNORMAL LOW (ref 17–63)
AST: 20 U/L (ref 15–41)
Albumin: 3.9 g/dL (ref 3.5–5.0)
Alkaline Phosphatase: 100 U/L (ref 38–126)
Anion gap: 7 (ref 5–15)
BUN: 24 mg/dL — ABNORMAL HIGH (ref 6–20)
CALCIUM: 9.3 mg/dL (ref 8.9–10.3)
CO2: 26 mmol/L (ref 22–32)
Chloride: 103 mmol/L (ref 101–111)
Creatinine, Ser: 1.37 mg/dL — ABNORMAL HIGH (ref 0.61–1.24)
GFR calc Af Amer: 50 mL/min — ABNORMAL LOW (ref >60)
GFR calc non Af Amer: 43 mL/min — ABNORMAL LOW (ref >60)
Glucose, Bld: 97 mg/dL (ref 65–99)
Potassium: 5.1 mmol/L (ref 3.5–5.1)
SODIUM: 136 mmol/L (ref 135–145)
Total Bilirubin: 0.4 mg/dL (ref 0.3–1.2)
Total Protein: 6.6 g/dL (ref 6.5–8.1)

## 2014-08-12 MED ORDER — SULFAMETHOXAZOLE-TRIMETHOPRIM 400-80 MG PO TABS
2.0000 | ORAL_TABLET | Freq: Two times a day (BID) | ORAL | Status: DC
Start: 2014-08-12 — End: 2014-09-05

## 2014-08-12 MED ORDER — ACETAMINOPHEN 325 MG PO TABS
650.0000 mg | ORAL_TABLET | Freq: Once | ORAL | Status: AC
  Administered 2014-08-12: 650 mg via ORAL

## 2014-08-12 MED ORDER — LIDOCAINE HCL (PF) 1 % IJ SOLN
5.0000 mL | Freq: Once | INTRAMUSCULAR | Status: AC
  Administered 2014-08-12: 5 mL via SUBCUTANEOUS

## 2014-08-12 MED ORDER — LIDOCAINE HCL (PF) 1 % IJ SOLN
INTRAMUSCULAR | Status: AC
  Administered 2014-08-12: 5 mL via SUBCUTANEOUS
  Filled 2014-08-12: qty 5

## 2014-08-12 MED ORDER — ACETAMINOPHEN 325 MG PO TABS
ORAL_TABLET | ORAL | Status: AC
  Administered 2014-08-12: 650 mg via ORAL
  Filled 2014-08-12: qty 2

## 2014-08-12 NOTE — ED Provider Notes (Signed)
[facility] Emergency Department Provider Note  ____________________________________________  Time seen: Approximately 2000  I have reviewed the triage vital signs and the nursing notes.   HISTORY  Chief Complaint Abscess   HPI Shawn Lyons is a 81 y.o. male presents to the ER for the complaint of right posterior axilla abscess that is draining. Patient and daughter reports he was seen by his primary care physician on Thursday and was started on Bactrim orally. The patient and daughter reports that in the last 2 days abscesses began to drain and presented tonight as concerned that the abscess needs  to be opened.  Patient reports that the abscess site is tender at 4 out of 10. Denies fever. Denies pain radiation. Reports he had that he has had a cyst in that area for 20+ years however the last week has had increase in redness and tenderness. Denies other pain or other complaints.  Reports that patient has a follow-up appointment with Dr. Burt Knack surgery this coming Thursday regarding the same. Was continues to eat and drink well   Past Medical History  Diagnosis Date  . Coronary artery disease   . Arthritis   . Depression   . Vitamin B12 deficiency     Patient Active Problem List   Diagnosis Date Noted  . DYSPNEA 12/08/2007  . HYPERLIPIDEMIA 12/04/2007  . CHRONIC OBSTRUCTIVE ASTHMA UNSPECIFIED 12/04/2007  . SLEEP APNEA 12/04/2007  . CORONARY HEART DISEASE 12/01/2007    History reviewed. No pertinent past surgical history.  Current Outpatient Rx  Name  Route  Sig  Dispense  Refill  . acetaminophen (TYLENOL) 325 MG tablet   Oral   Take 325-650 mg by mouth every 4 (four) hours as needed for moderate pain or fever.         Marland Kitchen acetaminophen (TYLENOL) 500 MG tablet   Oral   Take 1,000 mg by mouth every 4 (four) hours as needed for moderate pain (max of 402m per 24  hours).         .Marland Kitchenalum & mag hydroxide-simeth (MAALOX PLUS) 400-400-40 MG/5ML  suspension   Oral   Take 30 mLs by mouth every 6 (six) hours as needed for indigestion (gas, or upset stomach).         .Marland Kitchenaspirin EC 81 MG tablet   Oral   Take 81 mg by mouth daily.         .Marland Kitchenbismuth subsalicylate (PEPTO BISMOL) 262 MG/15ML suspension   Oral   Take 10 mLs by mouth every 6 (six) hours as needed for diarrhea or loose stools.         . cholecalciferol (VITAMIN D) 1000 UNITS tablet   Oral   Take 1,000 Units by mouth daily.         . Cyanocobalamin 1000 MCG/ML KIT   Injection   Inject 1 mL as directed every 30 (thirty) days. Around the 28th day of the month         . diphenhydrAMINE (BENADRYL) 25 MG tablet   Oral   Take 25 mg by mouth every 4 (four) hours as needed for itching or allergies.         . DiphenhydrAMINE HCl, Sleep, 50 MG CAPS   Oral   Take 1 capsule by mouth at bedtime.         . DULoxetine (CYMBALTA) 30 MG capsule   Oral   Take 30 mg by mouth daily.         .Marland Kitchen  furosemide (LASIX) 20 MG tablet   Oral   Take 20 mg by mouth daily as needed for fluid (swelling).         Marland Kitchen guaiFENesin (ROBITUSSIN) 100 MG/5ML liquid   Oral   Take 200 mg by mouth every 4 (four) hours as needed for cough.         . lansoprazole (PREVACID) 15 MG capsule   Oral   Take 15 mg by mouth daily at 12 noon.         . magnesium hydroxide (MILK OF MAGNESIA) 400 MG/5ML suspension   Oral   Take 30 mLs by mouth daily as needed for mild constipation.         . mirtazapine (REMERON) 15 MG tablet   Oral   Take 15 mg by mouth at bedtime.         Marland Kitchen nystatin (MYCOSTATIN) powder   Topical   Apply 1 g topically 2 (two) times daily as needed (to under breasts, groin, and abdominal fold areas for rash).         . polyethylene glycol (MIRALAX / GLYCOLAX) packet   Oral   Take 17 g by mouth daily.         . simvastatin (ZOCOR) 40 MG tablet   Oral   Take 40 mg by mouth at bedtime.         . Soft Lens Products (SENSITIVE EYES SALINE) SOLN   Both  Eyes   Place 1 drop into both eyes 2 (two) times daily as needed (itchy or dry eyes).         Marland Kitchen sulfamethoxazole-trimethoprim (BACTRIM) 400-80 MG per tablet   Oral   Take 2 tablets by mouth 2 (two) times daily. For 10 days   40 tablet   0     Allergies Augmentin and Prilosec  History reviewed. No pertinent family history.  Social History History  Substance Use Topics  . Smoking status: Never Smoker   . Smokeless tobacco: Never Used  . Alcohol Use: No    Review of Systems Constitutional: No fever/chills Eyes: No visual changes. ENT: No sore throat. Cardiovascular: Denies chest pain. Respiratory: Denies shortness of breath. Gastrointestinal: No abdominal pain.  No nausea, no vomiting.  No diarrhea.  No constipation. Genitourinary: Negative for dysuria. Musculoskeletal: Negative for back pain. Skin: Negative for rash. Neurological: Negative for headaches, focal weakness or numbness.  10-point ROS otherwise negative.  ____________________________________________   PHYSICAL EXAM:  VITAL SIGNS: ED Triage Vitals  Enc Vitals Group     BP 08/12/14 1721 140/61 mmHg     Pulse Rate 08/12/14 1721 83     Resp --      Temp 08/12/14 1721 97.7 F (36.5 C)     Temp Source 08/12/14 1721 Oral     SpO2 08/12/14 1721 98 %     Weight 08/12/14 1721 145 lb (65.772 kg)     Height 08/12/14 1721 5' 2"  (1.575 m)     Head Cir --      Peak Flow --      Pain Score 08/12/14 1953 10     Pain Loc --      Pain Edu? --      Excl. in [place]? --     Constitutional: Alert and oriented. Well appearing and in no acute distress. Eyes: Conjunctivae are normal. PERRL. EOMI. Head: Atraumatic. Nose: No congestion/rhinnorhea. Mouth/Throat: Mucous membranes are moist.  Oropharynx non-erythematous. Neck: No stridor.  No cervical spine tenderness to palpation.  Hematological/Lymphatic/Immunilogical: No cervical lymphadenopathy. Cardiovascular: Normal rate, regular rhythm. Grossly normal heart sounds.   Good peripheral circulation. Respiratory: Normal respiratory effort.  No retractions. Lungs CTAB. Gastrointestinal: Soft and nontender. No distention. No abdominal bruits. Musculoskeletal: No lower extremity tenderness nor edema.  No joint effusions. Neurologic:  Normal speech and language. No gross focal neurologic deficits are appreciated. Speech is normal. No gait instability. Skin:  Skin is warm, dry and intact. No rash noted. Right posterior exam axilla large fluctuant and pointing abscess. Mild erythematous base. No surrounding erythema or induration. Actively draining purulent material. See picture attached to chart. Psychiatric: Mood and affect are normal. Speech and behavior are normal.  ____________________________________________   LABS (all labs ordered are listed, but only abnormal results are displayed)  Labs Reviewed  CBC WITH DIFFERENTIAL/PLATELET - Abnormal; Notable for the following:    RBC 3.57 (*)    Hemoglobin 10.3 (*)    HCT 31.2 (*)    RDW 14.6 (*)    All other components within normal limits  COMPREHENSIVE METABOLIC PANEL - Abnormal; Notable for the following:    BUN 24 (*)    Creatinine, Ser 1.37 (*)    ALT 15 (*)    GFR calc non Af Amer 43 (*)    GFR calc Af Amer 50 (*)    All other components within normal limits  WOUND CULTURE     PROCEDURES  Procedure(s) performed: INCISION AND DRAINAGE Performed by: Marylene Land Consent: Verbal consent obtained. Risks and benefits: risks, benefits and alternatives were discussed Type: abscess/sebaceous cyst  Body area: right posterior axilla  Anesthesia: local infiltration  Incision was made with a scalpel.  Local anesthetic: lidocaine 1%   Anesthetic total: 4 ml  Complexity: complex Blunt dissection to break up loculations  Drainage: purulent and thick   Drainage amount: very large  Packing material: 1/4 in iodoform gauze  Patient tolerance: Patient tolerated the procedure well with no  immediate complications.     INITIAL IMPRESSION / ASSESSMENT AND PLAN / ED COURSE  Pertinent labs & imaging results that were available during my care of the patient were reviewed by me and considered in my medical decision making (see chart for details).  Presents to the ER for the complaint of infected right posterior axillary cyst. Patient currently on Bactrim 1 tablet twice a day. Discussed patient and planning care with Dr. Les Pou who also saw patient. Concerned that 1 tablet twice a day is not sufficient of Bactrim dose will increase to 2 tablets twice a day. Discussed this with patient and family.    infected cyst opened and drained in ER with very large amount of purulent and thick discharge obtained. Patient tolerated well. Cyst packed with with gauze. Patient to follow-up with surgery in 2 days. Discussed return to ER if unable to see by surgery in 2 days. Discussed strict follow-up and return parameters. Patient and family agree to plan. ______________________________________   FINAL CLINICAL IMPRESSION(S) / ED DIAGNOSES  Final diagnoses:  Abscess  Sebaceous cyst  right posterior axilla    Marylene Land, NP 08/12/14 2136  Hinda Kehr, MD 08/13/14 [phone]

## 2014-08-12 NOTE — ED Notes (Signed)
Patient to ED with report of abscess which is now draining to back of right arm. Patient is scheduled to see surgeon next week but area is now draining more and nurses at facility felt he needed evaluation.

## 2014-08-12 NOTE — Discharge Instructions (Signed)
Take medication as prescribed. Keep clean.  Follow-up in 2 days with Dr. Burt Knack surgeon or return to ER.  Return to the ER for increased pain, redness, fever, new or worsening concerns.  Abscess An abscess is an infected area that contains a collection of pus and debris.It can occur in almost any part of the body. An abscess is also known as a furuncle or boil. CAUSES  An abscess occurs when tissue gets infected. This can occur from blockage of oil or sweat glands, infection of hair follicles, or a minor injury to the skin. As the body tries to fight the infection, pus collects in the area and creates pressure under the skin. This pressure causes pain. People with weakened immune systems have difficulty fighting infections and get certain abscesses more often.  SYMPTOMS Usually an abscess develops on the skin and becomes a painful mass that is red, warm, and tender. If the abscess forms under the skin, you may feel a moveable soft area under the skin. Some abscesses break open (rupture) on their own, but most will continue to get worse without care. The infection can spread deeper into the body and eventually into the bloodstream, causing you to feel ill.  DIAGNOSIS  Your caregiver will take your medical history and perform a physical exam. A sample of fluid may also be taken from the abscess to determine what is causing your infection. TREATMENT  Your caregiver may prescribe antibiotic medicines to fight the infection. However, taking antibiotics alone usually does not cure an abscess. Your caregiver may need to make a small cut (incision) in the abscess to drain the pus. In some cases, gauze is packed into the abscess to reduce pain and to continue draining the area. HOME CARE INSTRUCTIONS   Only take over-the-counter or prescription medicines for pain, discomfort, or fever as directed by your caregiver.  If you were prescribed antibiotics, take them as directed. Finish them even if you start  to feel better.  If gauze is used, follow your caregiver's directions for changing the gauze.  To avoid spreading the infection:  Keep your draining abscess covered with a bandage.  Wash your hands well.  Do not share personal care items, towels, or whirlpools with others.  Avoid skin contact with others.  Keep your skin and clothes clean around the abscess.  Keep all follow-up appointments as directed by your caregiver. SEEK MEDICAL CARE IF:   You have increased pain, swelling, redness, fluid drainage, or bleeding.  You have muscle aches, chills, or a general ill feeling.  You have a fever. MAKE SURE YOU:   Understand these instructions.  Will watch your condition.  Will get help right away if you are not doing well or get worse. Document Released: 12/10/2004 Document Revised: 09/01/2011 Document Reviewed: 05/15/2011 [facility] Patient Information 2015 [place], [place]. This information is not intended to replace advice given to you by your health care provider. Make sure you discuss any questions you have with your health care provider.  Epidermal Cyst An epidermal cyst is usually a small, painless lump under the skin. Cysts often occur on the face, neck, stomach, chest, or genitals. The cyst may be filled with a bad smelling paste. Do not pop your cyst. Popping the cyst can cause pain and puffiness (swelling). HOME CARE   Only take medicines as told by your doctor.  Take your medicine (antibiotics) as told. Finish it even if you start to feel better. GET HELP RIGHT AWAY IF:  Your cyst  is tender, red, or puffy.  You are not getting better, or you are getting worse.  You have any questions or concerns. MAKE SURE YOU:  Understand these instructions.  Will watch your condition.  Will get help right away if you are not doing well or get worse. Document Released: 04/09/2004 Document Revised: 09/01/2011 Document Reviewed: 09/08/2010 [facility] Patient Information 2015  [place], [place]. This information is not intended to replace advice given to you by your health care provider. Make sure you discuss any questions you have with your health care provider.

## 2014-08-15 ENCOUNTER — Emergency Department
Admission: EM | Admit: 2014-08-15 | Discharge: 2014-08-15 | Disposition: A | Discharge status: Home / Self Care | Payer: Medicare Other | Attending: Student | Admitting: Student

## 2014-08-15 ENCOUNTER — Encounter: Payer: Self-pay | Admitting: Emergency Medicine

## 2014-08-15 DIAGNOSIS — Z792 Long term (current) use of antibiotics: Secondary | ICD-10-CM | POA: Insufficient documentation

## 2014-08-15 DIAGNOSIS — Z79899 Other long term (current) drug therapy: Secondary | ICD-10-CM | POA: Diagnosis not present

## 2014-08-15 DIAGNOSIS — Z5189 Encounter for other specified aftercare: Secondary | ICD-10-CM

## 2014-08-15 DIAGNOSIS — Z880 Allergy status to penicillin: Secondary | ICD-10-CM | POA: Insufficient documentation

## 2014-08-15 DIAGNOSIS — Z4801 Encounter for change or removal of surgical wound dressing: Secondary | ICD-10-CM | POA: Insufficient documentation

## 2014-08-15 NOTE — ED Notes (Signed)
Here for wound check  Under right arm

## 2014-08-15 NOTE — ED Notes (Signed)
Pt came in for getting his abscess rechecked. The abscess on right upper side of the back near the scapula.The abscess was drained this Sunday . The patient's daughter states that he was supposed to see the surgeon tomorrow but could not get any appointment anywhere so came to the ED.

## 2014-08-15 NOTE — Discharge Instructions (Signed)
Follow up with Doctor Burt Knack tomorrow.

## 2014-08-15 NOTE — ED Provider Notes (Signed)
[facility] Emergency Department Provider Note  ____________________________________________  Time seen: Approximately 10:49 AM  I have reviewed the triage vital signs and the nursing notes.   HISTORY  Chief Complaint Wound Check    HPI Shawn Lyons is a 81 y.o. male status post I&D of a cyst right upper back on on 08/12/2014. Patient is scheduled follow-up surgical clinic. However the patient was not to the [facility] has purulent drainage adhesive dressing change. Patient stated no fevers chills associated with this Complaint. He stated there is no pain associated with this visit.   Past Medical History  Diagnosis Date  . Coronary artery disease   . Arthritis   . Depression   . Vitamin B12 deficiency     Patient Active Problem List   Diagnosis Date Noted  . DYSPNEA 12/08/2007  . HYPERLIPIDEMIA 12/04/2007  . CHRONIC OBSTRUCTIVE ASTHMA UNSPECIFIED 12/04/2007  . SLEEP APNEA 12/04/2007  . CORONARY HEART DISEASE 12/01/2007    History reviewed. No pertinent past surgical history.  Current Outpatient Rx  Name  Route  Sig  Dispense  Refill  . acetaminophen (TYLENOL) 325 MG tablet   Oral   Take 325-650 mg by mouth every 4 (four) hours as needed for moderate pain or fever.         Marland Kitchen acetaminophen (TYLENOL) 500 MG tablet   Oral   Take 1,000 mg by mouth every 4 (four) hours as needed for moderate pain (max of 4061m per 24  hours).         .Marland Kitchenalum & mag hydroxide-simeth (MAALOX PLUS) 400-400-40 MG/5ML suspension   Oral   Take 30 mLs by mouth every 6 (six) hours as needed for indigestion (gas, or upset stomach).         .Marland Kitchenaspirin EC 81 MG tablet   Oral   Take 81 mg by mouth daily.         .Marland Kitchenbismuth subsalicylate (PEPTO BISMOL) 262 MG/15ML suspension   Oral   Take 10 mLs by mouth every 6 (six) hours as needed for diarrhea or loose stools.         . cholecalciferol (VITAMIN D) 1000 UNITS tablet   Oral   Take 1,000 Units by mouth  daily.         . Cyanocobalamin 1000 MCG/ML KIT   Injection   Inject 1 mL as directed every 30 (thirty) days. Around the 28th day of the month         . diphenhydrAMINE (BENADRYL) 25 MG tablet   Oral   Take 25 mg by mouth every 4 (four) hours as needed for itching or allergies.         . DiphenhydrAMINE HCl, Sleep, 50 MG CAPS   Oral   Take 1 capsule by mouth at bedtime.         . DULoxetine (CYMBALTA) 30 MG capsule   Oral   Take 30 mg by mouth daily.         . furosemide (LASIX) 20 MG tablet   Oral   Take 20 mg by mouth daily as needed for fluid (swelling).         .Marland KitchenguaiFENesin (ROBITUSSIN) 100 MG/5ML liquid   Oral   Take 200 mg by mouth every 4 (four) hours as needed for cough.         . lansoprazole (PREVACID) 15 MG capsule   Oral   Take 15 mg by mouth daily at 12 noon.         .Marland Kitchen  magnesium hydroxide (MILK OF MAGNESIA) 400 MG/5ML suspension   Oral   Take 30 mLs by mouth daily as needed for mild constipation.         . mirtazapine (REMERON) 15 MG tablet   Oral   Take 15 mg by mouth at bedtime.         Marland Kitchen nystatin (MYCOSTATIN) powder   Topical   Apply 1 g topically 2 (two) times daily as needed (to under breasts, groin, and abdominal fold areas for rash).         . polyethylene glycol (MIRALAX / GLYCOLAX) packet   Oral   Take 17 g by mouth daily.         . simvastatin (ZOCOR) 40 MG tablet   Oral   Take 40 mg by mouth at bedtime.         . Soft Lens Products (SENSITIVE EYES SALINE) SOLN   Both Eyes   Place 1 drop into both eyes 2 (two) times daily as needed (itchy or dry eyes).         Marland Kitchen sulfamethoxazole-trimethoprim (BACTRIM) 400-80 MG per tablet   Oral   Take 2 tablets by mouth 2 (two) times daily. For 10 days   40 tablet   0     Allergies Augmentin and Prilosec  History reviewed. No pertinent family history.  Social History History  Substance Use Topics  . Smoking status: Never Smoker   . Smokeless tobacco: Never Used   . Alcohol Use: No    Review of Systems Constitutional: No fever/chills Eyes: No visual changes. ENT: No sore throat. Cardiovascular: Denies chest pain. Respiratory: Denies shortness of breath. Gastrointestinal: No abdominal pain.  No nausea, no vomiting.  No diarrhea.  No constipation. Genitourinary: Negative for dysuria. Musculoskeletal: Negative for back pain. Skin: I&D site Neurological: Negative for headaches, focal weakness or numbness. 10-point ROS otherwise negative.  ____________________________________________   PHYSICAL EXAM:  VITAL SIGNS: ED Triage Vitals  Enc Vitals Group     BP 08/15/14 0940 140/79 mmHg     Pulse Rate 08/15/14 0940 79     Resp 08/15/14 0940 20     Temp 08/15/14 0940 98.1 F (36.7 C)     Temp Source 08/15/14 0940 Oral     SpO2 08/15/14 0940 98 %     Weight 08/15/14 0940 145 lb (65.772 kg)     Height 08/15/14 0940 5' 2"  (1.575 m)     Head Cir --      Peak Flow --      Pain Score 08/15/14 1023 0     Pain Loc --      Pain Edu? --      Excl. in [place]? --     Constitutional: Alert and oriented. Well appearing and in no acute distress. Eyes: Conjunctivae are normal. PERRL. EOMI. Head: Atraumatic. Nose: No congestion/rhinnorhea. Mouth/Throat: Mucous membranes are moist.  Oropharynx non-erythematous. Neck: No stridor.   Hematological/Lymphatic/Immunilogical: No cervical lymphadenopathy. Cardiovascular: Normal rate, regular rhythm. Grossly normal heart sounds.  Good peripheral circulation. Respiratory: Normal respiratory effort.  No retractions. Lungs CTAB. Gastrointestinal: Soft and nontender. No distention. No abdominal bruits. No CVA tenderness. Musculoskeletal: No lower extremity tenderness nor edema.  No joint effusions. Neurologic:  Normal speech and language. No gross focal neurologic deficits are appreciated. Speech is normal. No gait instability. Skin:  I&D site of the right upper scapular area shows no increasing signs of erythema or  edema. Patient neurovascularly intact. Psychiatric: Mood and affect are normal.  Speech and behavior are normal.  ____________________________________________   LABS (all labs ordered are listed, but only abnormal results are displayed)  Labs Reviewed - No data to display ____________________________________________  EKG   ____________________________________________  RADIOLOGY   ____________________________________________   PROCEDURES  Procedure(s) performed: None  Critical Care performed: No  ____________________________________________   INITIAL IMPRESSION / ASSESSMENT AND PLAN / ED COURSE  Pertinent labs & imaging results that were available during my care of the patient were reviewed by me and considered in my medical decision making (see chart for details).  Wound check. Follow-up as scheduled surgical clinic visit tomorrow.Area irrigate and re-pack/bandage. ____________________________________________   FINAL CLINICAL IMPRESSION(S) / ED DIAGNOSES  Final diagnoses:  Visit for wound check      Sable Feil, PA-C 08/15/14 [place], MD 08/15/14 1454

## 2014-08-16 ENCOUNTER — Ambulatory Visit (INDEPENDENT_AMBULATORY_CARE_PROVIDER_SITE_OTHER): Payer: Medicare Other | Admitting: Surgery

## 2014-08-16 ENCOUNTER — Encounter: Payer: Self-pay | Admitting: Surgery

## 2014-08-16 VITALS — BP 122/67 | HR 76 | Temp 97.9°F | Resp 18

## 2014-08-16 DIAGNOSIS — L723 Sebaceous cyst: Secondary | ICD-10-CM | POA: Diagnosis not present

## 2014-08-16 NOTE — Progress Notes (Signed)
  Surgical Consultation  08/16/2014  Shawn Lyons is an 81 y.o. male.   CC: Infected sebaceous cyst  HPI: This a patient with an infected sebaceous cyst of the right axilla posteriorly. He has been to the ER and has been doing local dressing changes. He is currently on Bactrim. Denies fevers or chills. And pain is much improved.  Past Medical History  Diagnosis Date  . Coronary artery disease   . Arthritis   . Depression   . Vitamin B12 deficiency     Past Surgical History  Procedure Laterality Date  . Eye surgery    . Hernia repair    . Revision urostomy cutaneous  2000  . Mohs surgery  2012  . Knee arthroscopy Left 2006    Family History  Problem Relation Age of Onset  . Heart attack Father     Social History:  reports that he has quit smoking. He has never used smokeless tobacco. He reports that he does not drink alcohol or use illicit drugs.  Allergies:  Allergies  Allergen Reactions  . Augmentin [Amoxicillin-Pot Clavulanate] Nausea Only  . Prilosec [Omeprazole] Diarrhea    Medications reviewed.   Review of Systems:   Review of Systems  Constitutional: Negative for fever and chills.  HENT: Negative.   Eyes: Negative.   Respiratory: Negative.   Cardiovascular: Negative.   Gastrointestinal: Negative.   Genitourinary: Negative.   Musculoskeletal: Negative.   Skin: Negative for rash.       Drainage from right posterior axillary wound  Neurological: Negative.   Endo/Heme/Allergies: Negative.   Psychiatric/Behavioral: Negative.      Physical Exam:  BP 122/67 mmHg  Pulse 76  Temp(Src) 97.9 F (36.6 C) (Oral)  Resp 18  Physical Exam  Constitutional:  Elderly  HENT:  Head: Normocephalic.  Eyes: No scleral icterus.  Neck:  Contracted lordotic  Skin: Skin is warm and dry.  Resolving erythema with open wound draining minimal purulence. Nontender no cellulitis      No results found for this or any previous visit (from the past 48  hour(s)). No results found.  Assessment/Plan:  Infected sebaceous cyst of the posterior right axilla. Healing well with minimal purulence no no cellulitis. With packing placed and instructions to remove it tomorrow was performed. Two-view anabiotic's. Follow-up next week.  Florene Glen, MD, FACS

## 2014-08-16 NOTE — Patient Instructions (Signed)
Remove packing tomorrow and follow up next week

## 2014-08-17 ENCOUNTER — Telehealth: Payer: Self-pay | Admitting: Surgery

## 2014-08-17 NOTE — Telephone Encounter (Signed)
Mandy at [facility] would like to know if pt. Needs to continue with the antibiotics. Wk [phone] Cell [phone]

## 2014-08-22 ENCOUNTER — Ambulatory Visit (INDEPENDENT_AMBULATORY_CARE_PROVIDER_SITE_OTHER): Payer: Medicare Other | Admitting: Surgery

## 2014-08-22 ENCOUNTER — Encounter: Payer: Self-pay | Admitting: Surgery

## 2014-08-22 VITALS — BP 121/64 | HR 76 | Temp 98.2°F | Ht 62.0 in | Wt 145.0 lb

## 2014-08-22 DIAGNOSIS — L03319 Cellulitis of trunk, unspecified: Secondary | ICD-10-CM

## 2014-08-22 DIAGNOSIS — L02219 Cutaneous abscess of trunk, unspecified: Secondary | ICD-10-CM

## 2014-08-22 NOTE — Patient Instructions (Signed)
Call or return to ER if you have increased pain, drainage, swelling or redness from wound.

## 2014-08-22 NOTE — Progress Notes (Signed)
Doing well.  Min drainage from I and D site. Pain minimal.  Redness improving.  Blood pressure 121/64, pulse 76, temperature 98.2 F (36.8 C), temperature source Oral, height 5\' 2"  (1.575 m), weight 145 lb (65.772 kg). GEN: NAD/A&Ox3 TRUNK: right posterior axillary abscess with some drainage, mild erythema, min tender, no obvious fluctuance  A/P 81 yo s/p I and D of axillary abscess. Doing well - continue wound care - f/u in 2 weeks to ensure resolution

## 2014-08-28 ENCOUNTER — Telehealth: Payer: Self-pay | Admitting: Surgery

## 2014-08-28 ENCOUNTER — Other Ambulatory Visit: Payer: Self-pay | Admitting: *Deleted

## 2014-08-28 DIAGNOSIS — L03319 Cellulitis of trunk, unspecified: Principal | ICD-10-CM

## 2014-08-28 DIAGNOSIS — L02219 Cutaneous abscess of trunk, unspecified: Secondary | ICD-10-CM

## 2014-08-28 MED ORDER — CEPHALEXIN 500 MG PO CAPS: 500.0000 mg | ORAL_CAPSULE | Freq: Four times a day (QID) | ORAL | Status: DC

## 2014-08-28 NOTE — Telephone Encounter (Signed)
Leafy Ro form [facility] [phone] called regarding pt. He has developed a fistula and draining green. Does he need an antibiotic or maybe move appt up? Please call

## 2014-08-28 NOTE — Telephone Encounter (Signed)
Returned [employer] call and informed her that a prescription for Keflex 500 mg QID for 10 days was sent to the patient's pharmacy as per Dr Burt Knack. Leafy Ro confirmed understanding of information.

## 2014-09-05 ENCOUNTER — Ambulatory Visit (INDEPENDENT_AMBULATORY_CARE_PROVIDER_SITE_OTHER): Payer: Medicare Other | Admitting: Surgery

## 2014-09-05 ENCOUNTER — Encounter: Payer: Self-pay | Admitting: Surgery

## 2014-09-05 VITALS — BP 162/72 | HR 73 | Temp 98.3°F | Ht 63.0 in | Wt 145.4 lb

## 2014-09-05 DIAGNOSIS — L03319 Cellulitis of trunk, unspecified: Secondary | ICD-10-CM

## 2014-09-05 DIAGNOSIS — L02219 Cutaneous abscess of trunk, unspecified: Secondary | ICD-10-CM

## 2014-09-05 NOTE — Patient Instructions (Signed)
   Follow-up with our office as needed.  Please call and ask to speak with a nurse if you develop questions or concerns.  

## 2014-09-05 NOTE — Progress Notes (Signed)
Subjective:     Patient ID: Shawn Lyons, male   DOB: 1919/07/28, 81 y.o.   MRN: [id number]  HPI Status post incision and drainage of right posterior fold axillary abscess on June 2. He is here for final follow-up. There is been no further drainage. He is completed oral antibiotics weeks ago.  Review of Systems Negative    Objective:   Physical Exam    the area of the incision and drainage site is now an area of granulation tissue measuring 5 mm in size. It is superficial. Silver nitrate was applied. Patient tolerated the procedure well and a dry dressing was applied. Assessment:     And well status post I&D of right posterior axillary fold abscess.    Plan:        He is continuing to do well. We'll see him back in the office as needed.

## 2014-12-07 ENCOUNTER — Inpatient Hospital Stay
Admission: EM | Admit: 2014-12-07 | Discharge: 2014-12-11 | DRG: 871 | Disposition: A | Discharge status: Skilled Nursing Facility | Attending: Internal Medicine | Admitting: Internal Medicine

## 2014-12-07 DIAGNOSIS — F329 Major depressive disorder, single episode, unspecified: Secondary | ICD-10-CM | POA: Diagnosis present

## 2014-12-07 DIAGNOSIS — Z87891 Personal history of nicotine dependence: Secondary | ICD-10-CM

## 2014-12-07 DIAGNOSIS — E538 Deficiency of other specified B group vitamins: Secondary | ICD-10-CM | POA: Diagnosis present

## 2014-12-07 DIAGNOSIS — E871 Hypo-osmolality and hyponatremia: Secondary | ICD-10-CM | POA: Diagnosis not present

## 2014-12-07 DIAGNOSIS — M1990 Unspecified osteoarthritis, unspecified site: Secondary | ICD-10-CM | POA: Diagnosis present

## 2014-12-07 DIAGNOSIS — N390 Urinary tract infection, site not specified: Secondary | ICD-10-CM | POA: Diagnosis present

## 2014-12-07 DIAGNOSIS — G9340 Encephalopathy, unspecified: Secondary | ICD-10-CM | POA: Diagnosis present

## 2014-12-07 DIAGNOSIS — N179 Acute kidney failure, unspecified: Secondary | ICD-10-CM | POA: Diagnosis present

## 2014-12-07 DIAGNOSIS — Z8249 Family history of ischemic heart disease and other diseases of the circulatory system: Secondary | ICD-10-CM

## 2014-12-07 DIAGNOSIS — Z9889 Other specified postprocedural states: Secondary | ICD-10-CM

## 2014-12-07 DIAGNOSIS — A419 Sepsis, unspecified organism: Principal | ICD-10-CM | POA: Diagnosis present

## 2014-12-07 DIAGNOSIS — I2510 Atherosclerotic heart disease of native coronary artery without angina pectoris: Secondary | ICD-10-CM | POA: Diagnosis present

## 2014-12-07 DIAGNOSIS — Z888 Allergy status to other drugs, medicaments and biological substances status: Secondary | ICD-10-CM

## 2014-12-07 DIAGNOSIS — F0390 Unspecified dementia without behavioral disturbance: Secondary | ICD-10-CM | POA: Diagnosis present

## 2014-12-07 DIAGNOSIS — K219 Gastro-esophageal reflux disease without esophagitis: Secondary | ICD-10-CM | POA: Diagnosis present

## 2014-12-07 DIAGNOSIS — E785 Hyperlipidemia, unspecified: Secondary | ICD-10-CM | POA: Diagnosis present

## 2014-12-07 DIAGNOSIS — Z66 Do not resuscitate: Secondary | ICD-10-CM | POA: Diagnosis present

## 2014-12-08 ENCOUNTER — Emergency Department

## 2014-12-08 DIAGNOSIS — F0390 Unspecified dementia without behavioral disturbance: Secondary | ICD-10-CM | POA: Diagnosis present

## 2014-12-08 DIAGNOSIS — Z936 Other artificial openings of urinary tract status: Secondary | ICD-10-CM | POA: Diagnosis not present

## 2014-12-08 DIAGNOSIS — E871 Hypo-osmolality and hyponatremia: Secondary | ICD-10-CM | POA: Diagnosis present

## 2014-12-08 DIAGNOSIS — E785 Hyperlipidemia, unspecified: Secondary | ICD-10-CM | POA: Diagnosis present

## 2014-12-08 DIAGNOSIS — G9340 Encephalopathy, unspecified: Secondary | ICD-10-CM | POA: Diagnosis present

## 2014-12-08 DIAGNOSIS — M1990 Unspecified osteoarthritis, unspecified site: Secondary | ICD-10-CM | POA: Diagnosis present

## 2014-12-08 DIAGNOSIS — A419 Sepsis, unspecified organism: Secondary | ICD-10-CM | POA: Diagnosis present

## 2014-12-08 DIAGNOSIS — Z9889 Other specified postprocedural states: Secondary | ICD-10-CM | POA: Diagnosis not present

## 2014-12-08 DIAGNOSIS — E538 Deficiency of other specified B group vitamins: Secondary | ICD-10-CM | POA: Diagnosis present

## 2014-12-08 DIAGNOSIS — I2510 Atherosclerotic heart disease of native coronary artery without angina pectoris: Secondary | ICD-10-CM | POA: Diagnosis present

## 2014-12-08 DIAGNOSIS — N179 Acute kidney failure, unspecified: Secondary | ICD-10-CM | POA: Diagnosis present

## 2014-12-08 DIAGNOSIS — Z87891 Personal history of nicotine dependence: Secondary | ICD-10-CM | POA: Diagnosis not present

## 2014-12-08 DIAGNOSIS — Z888 Allergy status to other drugs, medicaments and biological substances status: Secondary | ICD-10-CM | POA: Diagnosis not present

## 2014-12-08 DIAGNOSIS — F329 Major depressive disorder, single episode, unspecified: Secondary | ICD-10-CM | POA: Diagnosis present

## 2014-12-08 DIAGNOSIS — N390 Urinary tract infection, site not specified: Secondary | ICD-10-CM | POA: Diagnosis present

## 2014-12-08 DIAGNOSIS — K219 Gastro-esophageal reflux disease without esophagitis: Secondary | ICD-10-CM | POA: Diagnosis present

## 2014-12-08 DIAGNOSIS — Z66 Do not resuscitate: Secondary | ICD-10-CM | POA: Diagnosis present

## 2014-12-08 DIAGNOSIS — Z8249 Family history of ischemic heart disease and other diseases of the circulatory system: Secondary | ICD-10-CM | POA: Diagnosis not present

## 2014-12-08 LAB — URINALYSIS COMPLETE WITH MICROSCOPIC (ARMC ONLY)
Bilirubin Urine: NEGATIVE
Glucose, UA: NEGATIVE mg/dL
Hgb urine dipstick: NEGATIVE
Ketones, ur: NEGATIVE mg/dL
Nitrite: NEGATIVE
Protein, ur: 100 mg/dL — AB
Specific Gravity, Urine: 1.009 (ref 1.005–1.030)
pH: 6 (ref 5.0–8.0)

## 2014-12-08 LAB — COMPREHENSIVE METABOLIC PANEL
ALT: 12 U/L — ABNORMAL LOW (ref 17–63)
ANION GAP: 10 (ref 5–15)
AST: 24 U/L (ref 15–41)
Albumin: 3.3 g/dL — ABNORMAL LOW (ref 3.5–5.0)
Alkaline Phosphatase: 72 U/L (ref 38–126)
BUN: 28 mg/dL — ABNORMAL HIGH (ref 6–20)
CHLORIDE: 93 mmol/L — AB (ref 101–111)
CO2: 23 mmol/L (ref 22–32)
Calcium: 8.6 mg/dL — ABNORMAL LOW (ref 8.9–10.3)
Creatinine, Ser: 1.46 mg/dL — ABNORMAL HIGH (ref 0.61–1.24)
GFR, EST AFRICAN AMERICAN: 46 mL/min — AB (ref >60)
GFR, EST NON AFRICAN AMERICAN: 40 mL/min — AB (ref >60)
Glucose, Bld: 116 mg/dL — ABNORMAL HIGH (ref 65–99)
POTASSIUM: 4.3 mmol/L (ref 3.5–5.1)
SODIUM: 126 mmol/L — AB (ref 135–145)
Total Bilirubin: 0.9 mg/dL (ref 0.3–1.2)
Total Protein: 5.7 g/dL — ABNORMAL LOW (ref 6.5–8.1)

## 2014-12-08 LAB — CBC WITH DIFFERENTIAL/PLATELET
Basophils Absolute: 0 10*3/uL (ref 0–0.1)
Basophils Relative: 0 %
Eosinophils Absolute: 0 10*3/uL (ref 0–0.7)
Eosinophils Relative: 0 %
HEMATOCRIT: 29.4 % — AB (ref 40.0–52.0)
HEMOGLOBIN: 9.7 g/dL — AB (ref 13.0–18.0)
LYMPHS ABS: 0.4 10*3/uL — AB (ref 1.0–3.6)
Lymphocytes Relative: 3 %
MCH: 28.6 pg (ref 26.0–34.0)
MCHC: 32.9 g/dL (ref 32.0–36.0)
MCV: 86.9 fL (ref 80.0–100.0)
MONOS PCT: 5 %
Monocytes Absolute: 0.8 10*3/uL (ref 0.2–1.0)
NEUTROS PCT: 92 %
Neutro Abs: 14.2 10*3/uL — ABNORMAL HIGH (ref 1.4–6.5)
Platelets: 181 10*3/uL (ref 150–440)
RBC: 3.38 MIL/uL — AB (ref 4.40–5.90)
RDW: 14.9 % — ABNORMAL HIGH (ref 11.5–14.5)
WBC: 15.5 10*3/uL — AB (ref 3.8–10.6)

## 2014-12-08 LAB — LACTIC ACID, PLASMA
LACTIC ACID, VENOUS: 1.3 mmol/L (ref 0.5–2.0)
Lactic Acid, Venous: 1.2 mmol/L (ref 0.5–2.0)

## 2014-12-08 LAB — MRSA PCR SCREENING: MRSA by PCR: NEGATIVE

## 2014-12-08 MED ORDER — ACETAMINOPHEN 650 MG RE SUPP: 650.0000 mg | Freq: Four times a day (QID) | RECTAL | Status: DC | PRN

## 2014-12-08 MED ORDER — OXYCODONE HCL 5 MG PO TABS
5.0000 mg | ORAL_TABLET | ORAL | Status: DC | PRN
  Administered 2014-12-08 – 2014-12-09 (×3): 5 mg via ORAL
  Filled 2014-12-08 (×3): qty 1

## 2014-12-08 MED ORDER — SODIUM CHLORIDE 0.9 % IJ SOLN
3.0000 mL | Freq: Two times a day (BID) | INTRAMUSCULAR | Status: DC
  Administered 2014-12-08 – 2014-12-11 (×6): 3 mL via INTRAVENOUS

## 2014-12-08 MED ORDER — POLYETHYLENE GLYCOL 3350 17 G PO PACK
17.0000 g | PACK | Freq: Every day | ORAL | Status: DC | PRN
  Administered 2014-12-10: 17 g via ORAL
  Filled 2014-12-08: qty 1

## 2014-12-08 MED ORDER — SODIUM CHLORIDE 0.9 % IV SOLN
INTRAVENOUS | Status: DC
  Administered 2014-12-08 – 2014-12-09 (×3): via INTRAVENOUS

## 2014-12-08 MED ORDER — VANCOMYCIN HCL IN DEXTROSE 1-5 GM/200ML-% IV SOLN
1000.0000 mg | Freq: Once | INTRAVENOUS | Status: AC
  Administered 2014-12-08: 1000 mg via INTRAVENOUS
  Filled 2014-12-08: qty 200

## 2014-12-08 MED ORDER — ASPIRIN EC 81 MG PO TBEC
81.0000 mg | DELAYED_RELEASE_TABLET | Freq: Every day | ORAL | Status: DC
  Administered 2014-12-08 – 2014-12-10 (×2): 81 mg via ORAL
  Filled 2014-12-08 (×2): qty 1

## 2014-12-08 MED ORDER — SENSITIVE EYES SALINE SOLN: 1.0000 [drp] | Status: DC | PRN

## 2014-12-08 MED ORDER — PANTOPRAZOLE SODIUM 40 MG PO TBEC
40.0000 mg | DELAYED_RELEASE_TABLET | Freq: Every day | ORAL | Status: DC
  Administered 2014-12-08 – 2014-12-11 (×4): 40 mg via ORAL
  Filled 2014-12-08 (×4): qty 1

## 2014-12-08 MED ORDER — POLYVINYL ALCOHOL 1.4 % OP SOLN: 1.0000 [drp] | OPHTHALMIC | Status: DC | PRN

## 2014-12-08 MED ORDER — ONDANSETRON HCL 4 MG PO TABS: 4.0000 mg | ORAL_TABLET | Freq: Four times a day (QID) | ORAL | Status: DC | PRN

## 2014-12-08 MED ORDER — SODIUM CHLORIDE 0.9 % IV SOLN
1250.0000 mg | INTRAVENOUS | Status: DC
  Administered 2014-12-08 – 2014-12-10 (×2): 1250 mg via INTRAVENOUS
  Filled 2014-12-08 (×3): qty 1250

## 2014-12-08 MED ORDER — MIRTAZAPINE 30 MG PO TABS
30.0000 mg | ORAL_TABLET | Freq: Every day | ORAL | Status: DC
  Administered 2014-12-08 – 2014-12-10 (×2): 30 mg via ORAL
  Filled 2014-12-08 (×2): qty 1

## 2014-12-08 MED ORDER — ONDANSETRON HCL 4 MG/2ML IJ SOLN: 4.0000 mg | Freq: Four times a day (QID) | INTRAMUSCULAR | Status: DC | PRN

## 2014-12-08 MED ORDER — DEXTROSE 5 % IV SOLN
2.0000 g | INTRAVENOUS | Status: DC
  Administered 2014-12-08 – 2014-12-10 (×3): 2 g via INTRAVENOUS
  Filled 2014-12-08 (×4): qty 2

## 2014-12-08 MED ORDER — DICLOFENAC SODIUM 1 % TD GEL
4.0000 g | Freq: Four times a day (QID) | TRANSDERMAL | Status: DC
  Administered 2014-12-08 – 2014-12-11 (×10): 4 g via TOPICAL
  Filled 2014-12-08 (×2): qty 100

## 2014-12-08 MED ORDER — SIMVASTATIN 40 MG PO TABS
40.0000 mg | ORAL_TABLET | Freq: Every day | ORAL | Status: DC
  Administered 2014-12-08 – 2014-12-10 (×2): 40 mg via ORAL
  Filled 2014-12-08 (×2): qty 1

## 2014-12-08 MED ORDER — DEXTROSE 5 % IV SOLN
1.0000 g | INTRAVENOUS | Status: DC
  Administered 2014-12-08: 1 g via INTRAVENOUS
  Filled 2014-12-08: qty 10

## 2014-12-08 MED ORDER — ACETAMINOPHEN 325 MG PO TABS: 650.0000 mg | ORAL_TABLET | Freq: Four times a day (QID) | ORAL | Status: DC | PRN

## 2014-12-08 MED ORDER — DULOXETINE HCL 30 MG PO CPEP
60.0000 mg | ORAL_CAPSULE | Freq: Every day | ORAL | Status: DC
  Administered 2014-12-08 – 2014-12-11 (×4): 60 mg via ORAL
  Filled 2014-12-08 (×5): qty 2

## 2014-12-08 MED ORDER — HEPARIN SODIUM (PORCINE) 5000 UNIT/ML IJ SOLN
5000.0000 [IU] | Freq: Three times a day (TID) | INTRAMUSCULAR | Status: DC
  Administered 2014-12-08 – 2014-12-11 (×9): 5000 [IU] via SUBCUTANEOUS
  Filled 2014-12-08 (×9): qty 1

## 2014-12-08 MED ORDER — MORPHINE SULFATE (PF) 2 MG/ML IV SOLN: 2.0000 mg | INTRAVENOUS | Status: DC | PRN

## 2014-12-08 MED ORDER — VITAMIN D 1000 UNITS PO TABS
1000.0000 [IU] | ORAL_TABLET | Freq: Every day | ORAL | Status: DC
  Administered 2014-12-08 – 2014-12-11 (×4): 1000 [IU] via ORAL
  Filled 2014-12-08 (×3): qty 1

## 2014-12-08 MED ORDER — ALBUTEROL SULFATE (2.5 MG/3ML) 0.083% IN NEBU
3.0000 mL | INHALATION_SOLUTION | RESPIRATORY_TRACT | Status: DC | PRN
Start: 2014-12-08 — End: 2014-12-11

## 2014-12-08 MED ORDER — SODIUM CHLORIDE 0.9 % IV BOLUS (SEPSIS)
30.0000 mL/kg | Freq: Once | INTRAVENOUS | Status: AC
  Administered 2014-12-08: 1932 mL via INTRAVENOUS

## 2014-12-08 NOTE — Progress Notes (Signed)
[place] [facility] [place] NAME: Shawn Lyons    MR#:  [id number]  DATE OF BIRTH:  [birth year]  SUBJECTIVE:  CHIEF COMPLAINT:   Chief Complaint  Patient presents with  . Code Sepsis   generalized weakness, confused  REVIEW OF SYSTEMS:  The patient is confused and denies any symptoms.  DRUG ALLERGIES:   Allergies  Allergen Reactions  . Augmentin [Amoxicillin-Pot Clavulanate] Nausea Only  . Prilosec [Omeprazole] Diarrhea    VITALS:  Blood pressure 128/50, pulse 85, temperature 98.2 F (36.8 C), temperature source Oral, resp. rate 18, height 5\' 9"  (1.753 m), weight 64.093 kg (141 lb 4.8 oz), SpO2 97 %.  PHYSICAL EXAMINATION:  GENERAL:  81 y.o.-year-old patient lying in the bed with no acute distress.  EYES: Pupils equal, round, reactive to light and accommodation. No scleral icterus. Extraocular muscles intact.  HEENT: Head atraumatic, normocephalic. Oropharynx and nasopharynx clear.  NECK:  Supple, no jugular venous distention. No thyroid enlargement, no tenderness.  LUNGS: Normal breath sounds bilaterally, no wheezing, rales,rhonchi or crepitation. No use of accessory muscles of respiration.  CARDIOVASCULAR: S1, S2 normal. No murmurs, rubs, or gallops.  ABDOMEN: Soft, nontender, nondistended. Bowel sounds present. No organomegaly or mass.  EXTREMITIES: No pedal edema, cyanosis, or clubbing.  NEUROLOGIC: The patient is confused and follow limited commands, unable to exam. PSYCHIATRIC: The patient is confused SKIN: No obvious rash, lesion, or ulcer.    LABORATORY PANEL:   CBC  Recent Labs Lab 12/08/14 0010  WBC 15.5*  HGB 9.7*  HCT 29.4*  PLT 181   ------------------------------------------------------------------------------------------------------------------  Chemistries   Recent Labs Lab 12/08/14 0010  NA 126*  K 4.3  CL 93*  CO2 23  GLUCOSE 116*  BUN 28*  CREATININE 1.46*  CALCIUM 8.6*  AST 24  ALT  12*  ALKPHOS 72  BILITOT 0.9   ------------------------------------------------------------------------------------------------------------------  Cardiac Enzymes No results for input(s): TROPONINI in the last 168 hours. ------------------------------------------------------------------------------------------------------------------  RADIOLOGY:  Dg Chest Port 1 View  12/08/2014   CLINICAL DATA:  Fever.  Being treated for pneumonia.  EXAM: PORTABLE CHEST 1 VIEW  COMPARISON:  07/19/2012.  FINDINGS: Normal sized heart. Clear lungs. Old, healed left rib fracture. Bilateral shoulder degenerative changes and superior migration of the humeral heads, greater on the right.  IMPRESSION: 1. No acute abnormality. 2. Bilateral chronic rotator cuff tears and shoulder degenerative changes.   Electronically Signed   By: Claudie Revering M.D.   On: 12/08/2014 00:55    EKG:   Orders placed or performed during the hospital encounter of 12/07/14  . EKG 12-Lead  . EKG 12-Lead    ASSESSMENT AND PLAN:   1.Sepsis with UTI,  Continue ceftriaxone/vancomycin follow-up blood in the urine culture  * Acute encephalopathy due to sepsis and UTI.  * Acute renal failure. Continue fluid to support and follow-up BMP.  2. Hyponatremia: IV fluid hydration normal saline follow sodium levels, hold diuretics 3. Hyperlipidemia unspecified: Zocor     All the records are reviewed and case discussed with Care Management/Social Workerr. Management plans discussed with the patient, family and they are in agreement.  CODE STATUS: DO NOT RESUSCITATE  TOTAL TIME TAKING CARE OF THIS PATIENT: 39 minutes.   POSSIBLE D/C IN 3 DAYS, DEPENDING ON CLINICAL CONDITION.   Demetrios Loll M.D on 12/08/2014 at 2:07 PM  Between 7am to 6pm - Pager - [phone]  After 6pm go to [email or web address] - password [email or web address] [facility]  Office  [phone]  CC: Primary care physician; Kirk Ruths., MD

## 2014-12-08 NOTE — Progress Notes (Addendum)
ANTIBIOTIC CONSULT NOTE - INITIAL  Pharmacy Consult for vancomycin and ceftriaxone dosing Indication: sepsis  Allergies  Allergen Reactions  . Augmentin [Amoxicillin-Pot Clavulanate] Nausea Only  . Prilosec [Omeprazole] Diarrhea    Patient Measurements: Height: 5\' 9"  (175.3 cm) Weight: 141 lb 4.8 oz (64.093 kg) IBW/kg (Calculated) : 70.7 Adjusted Body Weight: 64.4kg  Vital Signs: Temp: 98 F (36.7 C) (09/24 0422) Temp Source: Oral (09/24 0422) BP: 116/49 mmHg (09/24 0422) Pulse Rate: 81 (09/24 0422) Intake/Output from previous day: 09/23 0701 - 09/24 0700 In: -  Out: 400 [Urine:400] Intake/Output from this shift: Total I/O In: -  Out: 400 [Urine:400]  Labs:  Recent Labs  12/08/14 0010  WBC 15.5*  HGB 9.7*  PLT 181  CREATININE 1.46*   Estimated Creatinine Clearance: 28.7 mL/min (by C-G formula based on Cr of 1.46). No results for input(s): VANCOTROUGH, VANCOPEAK, VANCORANDOM, GENTTROUGH, GENTPEAK, GENTRANDOM, TOBRATROUGH, TOBRAPEAK, TOBRARND, AMIKACINPEAK, AMIKACINTROU, AMIKACIN in the last 72 hours.   Microbiology: No results found for this or any previous visit (from the past 720 hour(s)).  Medical History: Past Medical History  Diagnosis Date  . Coronary artery disease   . Arthritis   . Depression   . Vitamin B12 deficiency     Medications:   Assessment: CXR: no acute diseaase Blood and urine cx pending UA: LE(+) NO2(-) WBC TNTC  Goal of Therapy:  Vancomycin trough level 15-20 mcg/ml  Plan:  TBW 64.4kg  IBW 70.7kg  DW 64.4kg  Vd 43L kei 0.028 hr-1  T1/2 25 hours. Vancomycin 1250 mg q 36 hours ordered. Level ordered before 4th dose. Not at steady state.  Ceftriaxone 2 grams q 24 hours ordered.  McBane,Matthew S 12/08/2014,4:31 AM

## 2014-12-08 NOTE — ED Notes (Signed)
Called lab regarding status of lactic acid. They stated it is still in process. Collected at 00:10.

## 2014-12-08 NOTE — ED Provider Notes (Deleted)
Medical screening examination/treatment/procedure(s) were performed by non-physician practitioner and as supervising physician I was immediately available for consultation/collaboration.    Lisa Roca, MD 12/08/14 [phone]

## 2014-12-08 NOTE — Progress Notes (Signed)
Pt resting in bed no complaints voiced, poor appetite, family visiting

## 2014-12-08 NOTE — ED Notes (Signed)
Patient from [facility]. Fever, pneumonia

## 2014-12-08 NOTE — ED Notes (Signed)
Attempted to call 1A for hand-off report. Received response for a call back from [place], [place]. Awaiting call for hand-off report.

## 2014-12-08 NOTE — Progress Notes (Signed)
Pt. Admitted from ED to the unit with pneumonia. VSS. Running SR per telemetry monitor. Oxygen sats in the high 90's on RA. Urostomy bag patent. Resting quietly.

## 2014-12-08 NOTE — ED Provider Notes (Signed)
[facility] Emergency Department Provider Note   ____________________________________________  Time seen: Approximately 12 AM I have reviewed the triage vital signs and the triage nursing note.  HISTORY  Chief Complaint Code Sepsis   Historian Limited history is patient has dementia as well as altered mental status. History obtained from nursing home paperwork, EMS report  HPI Shawn Lyons is a 81 y.o. male who is followed by hospice and lives at [facility] assisted living, with a history of dementia, who was sent out to primary physician's office today for fever and cough and treated it sounds like presumptively for pneumonia with an IM 1 g of Rocephin shot, followed by transfer of care to nursing home skilled facility with prescription for Levaquin. Reportedly patient spiked a fever above 102 and was given Tylenol and then was noted to have some decreased alertness and sent to the ED for further evaluation.    Past Medical History  Diagnosis Date  . Coronary artery disease   . Arthritis   . Depression   . Vitamin B12 deficiency     Patient Active Problem List   Diagnosis Date Noted  . Cellulitis and abscess of trunk 08/22/2014  . DYSPNEA 12/08/2007  . HYPERLIPIDEMIA 12/04/2007  . CHRONIC OBSTRUCTIVE ASTHMA UNSPECIFIED 12/04/2007  . SLEEP APNEA 12/04/2007  . CORONARY HEART DISEASE 12/01/2007    Past Surgical History  Procedure Laterality Date  . Eye surgery    . Hernia repair    . Revision urostomy cutaneous  2000  . Mohs surgery  2012  . Knee arthroscopy Left 2006    Current Outpatient Rx  Name  Route  Sig  Dispense  Refill  . acetaminophen (TYLENOL) 500 MG tablet   Oral   Take 1,000 mg by mouth every 4 (four) hours as needed for moderate pain (max of 4061m per 24  hours).         .Marland Kitchenalbuterol (PROVENTIL HFA;VENTOLIN HFA) 108 (90 BASE) MCG/ACT inhaler   Inhalation   Inhale 1 puff into the lungs every 4 (four) hours as needed for  wheezing.         .Marland Kitchenaspirin EC 81 MG tablet   Oral   Take 81 mg by mouth at bedtime.          . cholecalciferol (VITAMIN D) 1000 UNITS tablet   Oral   Take 1,000 Units by mouth daily.         . Cyanocobalamin 1000 MCG/ML KIT   Injection   Inject 1 mL as directed every 30 (thirty) days. Around the 28th day of the month         . diclofenac sodium (VOLTAREN) 1 % GEL   Topical   Apply 4 g topically 4 (four) times daily. Apply to neck         . DiphenhydrAMINE HCl, Sleep, 50 MG CAPS   Oral   Take 1 capsule by mouth at bedtime.         . DULoxetine (CYMBALTA) 60 MG capsule   Oral   Take 60 mg by mouth daily.         . furosemide (LASIX) 20 MG tablet   Oral   Take 20 mg by mouth daily.          .Marland KitchenguaiFENesin-dextromethorphan (ROBITUSSIN DM) 100-10 MG/5ML syrup   Oral   Take 5 mLs by mouth every 4 (four) hours as needed for cough.         . lansoprazole (PREVACID) 15  MG capsule   Oral   Take 15 mg by mouth daily at 12 noon.         . loperamide (IMODIUM) 2 MG capsule   Oral   Take 2 mg by mouth every 12 (twelve) hours as needed for diarrhea or loose stools.         . mirtazapine (REMERON) 30 MG tablet   Oral   Take 30 mg by mouth at bedtime.          . polyethylene glycol (MIRALAX / GLYCOLAX) packet   Oral   Take 17 g by mouth daily as needed.          . simvastatin (ZOCOR) 40 MG tablet   Oral   Take 40 mg by mouth at bedtime.         . Soft Lens Products (SENSITIVE EYES SALINE) SOLN   Both Eyes   Place 1 drop into both eyes as needed (itchy or dry eyes).            Allergies Augmentin and Prilosec  Family History  Problem Relation Age of Onset  . Heart attack Father     Social History Social History  Substance Use Topics  . Smoking status: Former Research scientist (life sciences)  . Smokeless tobacco: Never Used  . Alcohol Use: No    Review of Systems Unable to obtain due to dementia and altered mental status  Constitutional:  Eyes:  ENT:    Cardiovascular:No chest pain Respiratory: Reportedly positive for cough Gastrointestinal: . Genitourinary:  Musculoskeletal:  Skin: Negative for rash. Neurological: . ____________________________________________   PHYSICAL EXAM:  VITAL SIGNS: ED Triage Vitals  Enc Vitals Group     BP 12/08/14 0005 85/43 mmHg     Pulse Rate 12/08/14 0005 84     Resp 12/08/14 0005 23     Temp 12/08/14 0005 101.4 F (38.6 C)     Temp Source 12/08/14 0005 Rectal     SpO2 12/08/14 0005 98 %     Weight 12/08/14 0005 142 lb (64.411 kg)     Height 12/08/14 0005 _0  (1.753 m)     Head Cir --      Peak Flow --      Pain Score 12/08/14 0007 0     Pain Loc --      Pain Edu? --      Excl. in [place]? --      Constitutional: Alert to voice. In no distress. Eyes: Conjunctivae are normal. PERRL. Normal extraocular movements. ENT   Head: Normocephalic and atraumatic.   Nose: No congestion/rhinnorhea.   Mouth/Throat: Mucous membranes are moist.   Neck: No stridor. Cardiovascular/Chest: Normal rate, regular rhythm.  No murmurs, rubs, or gallops. Respiratory: Normal respiratory effort without tachypnea nor retractions. Breath sounds are clear and equal bilaterally. No wheezes/rales/rhonchi. Gastrointestinal: Soft. No distention, no guarding, no rebound. Nontender . Urostomy bag right abdomen. Genitourinary/rectal: Postsurgical groin with penectomy. Musculoskeletal: Nontender with normal range of motion in all extremities. No joint effusions.  No lower extremity tenderness.  No edema. Neurologic:  Answers some questions yes and no. No gross or focal neurologic deficits are appreciated. Skin:  Skin is warm, dry and intact. No rash noted.   ____________________________________________   EKG I, Lisa Roca, MD, the attending physician have personally viewed and interpreted all ECGs.  87 bpm. Sinus rhythm with PVC. Normal axis. Narrow QRS. Normal ST and  T-wave ____________________________________________  LABS (pertinent positives/negatives)  Urinalysis 3+ leukocytes, too numerous to count white  blood cells and few bacteria with negative nitrites Comprehensive metabolic panel significant for sodium 126, BUN 28 and creatinine 1.46 White blood cell count 15.5 with left shift. Hemoglobin 9.7, platelet count 181 Lactate 1.3  ____________________________________________  RADIOLOGY All Xrays were viewed by me. Imaging interpreted by Radiologist.  Chest x-ray one view portable:IMPRESSION: 1. No acute abnormality. 2. Bilateral chronic rotator cuff tears and shoulder degenerative changes. __________________________________________  PROCEDURES  Procedure(s) performed: None  Critical Care performed: CRITICAL CARE Performed by: Lisa Roca   Total critical care time: 30 minutes  Critical care time was exclusive of separately billable procedures and treating other patients.  Critical care was necessary to treat or prevent imminent or life-threatening deterioration.  Critical care was time spent personally by me on the following activities: development of treatment plan with patient and/or surrogate as well as nursing, discussions with consultants, evaluation of patient's response to treatment, examination of patient, obtaining history from patient or surrogate, ordering and performing treatments and interventions, ordering and review of laboratory studies, ordering and review of radiographic studies, pulse oximetry and re-evaluation of patient's condition.   ____________________________________________   ED COURSE / ASSESSMENT AND PLAN  CONSULTATIONS: Face-to-face with hospitalist for admission  Pertinent labs & imaging results that were available during my care of the patient were reviewed by me and considered in my medical decision making (see chart for details).   Patient was brought in from nursing home with concern for  possible sepsis due to pneumonia which sounds like was a clinical diagnosis at office today.  Patient does have hypotension which responded to fluid bolus.  No hypoxia, and no infiltrate on chest x-ray, and I'm uncertain that pneumonia is the source. However given the fact that he did receive Rocephin for this earlier, I added vancomycin for sepsis coverage.   He does have a draining urostomy bag on this right abdomen, and the urinalysis was positive for bacteria and white blood cells, and so may be the this is a urinary source of sepsis.  Lactate is reassuring at 1.3.  Patient was found to be hyponatremic to 126, and fluid bolus was initiated in the emergency department.  I updated the family at the bedside regarding treatment of sepsis admission to hospital.    Patient / Family / Caregiver informed of clinical course, medical decision-making process, and agree with plan.    ___________________________________________   FINAL CLINICAL IMPRESSION(S) / ED DIAGNOSES   Final diagnoses:  Sepsis due to undetermined organism without resultant organ failure  Hyponatremia       Lisa Roca, MD 12/08/14 [phone]

## 2014-12-08 NOTE — H&P (Signed)
[place] at [place] NAME: Shawn Lyons    MR#:  [id number]  DATE OF BIRTH:  1919/09/14   DATE OF ADMISSION:  12/07/2014  PRIMARY CARE PHYSICIAN: Kirk Ruths., MD   REQUESTING/REFERRING PHYSICIAN: Reita Cliche  CHIEF COMPLAINT:   Chief Complaint  Patient presents with  . Code Sepsis   altered mental status/fever  HISTORY OF PRESENT ILLNESS:  Shawn Lyons  is a 81 y.o. 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 The patient is unable to provide meaningful information given mental status/medical condition. History obtained from family members present at bedside. They state that he has been lethargic for the last 2 days however worsened today. They noticed a cough that he was evaluated by his doctor believes this may of been related to pneumonia/bronchitis thus initiated antibiotics today (ceftriaxone 1 dose to be followed by oral Levaquin). Given mental status and then new onset fever present hospital for further workup and evaluation. Upon arrival to the emergency department code sepsis was initiated  PAST MEDICAL HISTORY:   Past Medical History  Diagnosis Date  . Coronary artery disease   . Arthritis   . Depression   . Vitamin B12 deficiency     PAST SURGICAL HISTORY:   Past Surgical History  Procedure Laterality Date  . Eye surgery    . Hernia repair    . Revision urostomy cutaneous  2000  . Mohs surgery  2012  . Knee arthroscopy Left 2006    SOCIAL HISTORY:   Social History  Substance Use Topics  . Smoking status: Former Research scientist (life sciences)  . Smokeless tobacco: Never Used  . Alcohol Use: No    FAMILY HISTORY:   Family History  Problem Relation Age of Onset  . Heart attack Father     DRUG ALLERGIES:   Allergies  Allergen Reactions  . Augmentin [Amoxicillin-Pot Clavulanate] Nausea Only  . Prilosec [Omeprazole] Diarrhea    REVIEW OF SYSTEMS:  Unable  to obtain given patient's mental status/medical condition   MEDICATIONS AT HOME:   Prior to Admission medications   Medication Sig Start Date End Date Taking? Authorizing Provider  acetaminophen (TYLENOL) 500 MG tablet Take 1,000 mg by mouth every 4 (four) hours as needed for moderate pain (max of 4060m per 24  hours).   Yes Historical Provider, MD  albuterol (PROVENTIL HFA;VENTOLIN HFA) 108 (90 BASE) MCG/ACT inhaler Inhale 1 puff into the lungs every 4 (four) hours as needed for wheezing.   Yes Historical Provider, MD  aspirin EC 81 MG tablet Take 81 mg by mouth at bedtime.    Yes Historical Provider, MD  cholecalciferol (VITAMIN D) 1000 UNITS tablet Take 1,000 Units by mouth daily.   Yes Historical Provider, MD  Cyanocobalamin 1000 MCG/ML KIT Inject 1 mL as directed every 30 (thirty) days. Around the 28th day of the month   Yes Historical Provider, MD  diclofenac sodium (VOLTAREN) 1 % GEL Apply 4 g topically 4 (four) times daily. Apply to neck   Yes Historical Provider, MD  DiphenhydrAMINE HCl, Sleep, 50 MG CAPS Take 1 capsule by mouth at bedtime.   Yes Historical Provider, MD  DULoxetine (CYMBALTA) 60 MG capsule Take 60 mg by mouth daily.   Yes Historical Provider, MD  furosemide (LASIX) 20 MG tablet Take 20 mg by mouth daily.    Yes Historical Provider, MD  guaiFENesin-dextromethorphan (ROBITUSSIN DM) 100-10 MG/5ML syrup Take 5 mLs by mouth every  4 (four) hours as needed for cough.   Yes Historical Provider, MD  lansoprazole (PREVACID) 15 MG capsule Take 15 mg by mouth daily at 12 noon.   Yes Historical Provider, MD  loperamide (IMODIUM) 2 MG capsule Take 2 mg by mouth every 12 (twelve) hours as needed for diarrhea or loose stools.   Yes Historical Provider, MD  mirtazapine (REMERON) 30 MG tablet Take 30 mg by mouth at bedtime.  08/14/14  Yes Historical Provider, MD  polyethylene glycol (MIRALAX / GLYCOLAX) packet Take 17 g by mouth daily as needed.    Yes Historical Provider, MD   simvastatin (ZOCOR) 40 MG tablet Take 40 mg by mouth at bedtime.   Yes Historical Provider, MD  Soft Lens Products (SENSITIVE EYES SALINE) SOLN Place 1 drop into both eyes as needed (itchy or dry eyes).    Yes Historical Provider, MD      VITAL SIGNS:  Blood pressure 113/56, pulse 84, temperature 99.2 F (37.3 C), temperature source Rectal, resp. rate 19, height _0  (1.753 m), weight 142 lb (64.411 kg), SpO2 100 %.  PHYSICAL EXAMINATION:  VITAL SIGNS: Filed Vitals:   12/08/14 0317  BP:   Pulse:   Temp: 99.2 F (37.3 C)  Resp:    GENERAL:81 y.o.male currently in moderate acute distress given mental status.  HEAD: Normocephalic, atraumatic.  EYES: Pupils equal, round, reactive to light. Extraocular muscles intact. No scleral icterus.  MOUTH: Dry mucosal membrane. Dentition intact. No abscess noted.  EAR, NOSE, THROAT: Clear without exudates. No external lesions.  NECK: Supple. No thyromegaly. No nodules. No JVD.  PULMONARY: Clear to ascultation, without wheeze rails or rhonci. No use of accessory muscles, Good respiratory effort. good air entry bilaterally CHEST: Nontender to palpation.  CARDIOVASCULAR: S1 and S2. Regular rate and rhythm. No murmurs, rubs, or gallops. No edema. Pedal pulses 2+ bilaterally.  GASTROINTESTINAL: Soft, nontender, nondistended. No masses. Positive bowel sounds. No hepatosplenomegaly. uroileostomy in place right lower quadrant without surrounding erythema MUSCULOSKELETAL: No swelling, clubbing, or edema. Passive Range of motion full in all extremities.  NEUROLOGIC: Unable to fully assess given patient's mental status/medical condition SKIN: No ulceration, lesions, rashes, or cyanosis. Skin warm and dry. Turgor intact.  PSYCHIATRIC: Unable to fully assess given patient's mental status/medical condition   LABORATORY PANEL:   CBC  Recent Labs Lab 12/08/14 0010  WBC 15.5*  HGB 9.7*  HCT 29.4*  PLT 181    ------------------------------------------------------------------------------------------------------------------  Chemistries   Recent Labs Lab 12/08/14 0010  NA 126*  K 4.3  CL 93*  CO2 23  GLUCOSE 116*  BUN 28*  CREATININE 1.46*  CALCIUM 8.6*  AST 24  ALT 12*  ALKPHOS 72  BILITOT 0.9   ------------------------------------------------------------------------------------------------------------------  Cardiac Enzymes No results for input(s): TROPONINI in the last 168 hours. ------------------------------------------------------------------------------------------------------------------  RADIOLOGY:  Dg Chest Port 1 View  12/08/2014   CLINICAL DATA:  Fever.  Being treated for pneumonia.  EXAM: PORTABLE CHEST 1 VIEW  COMPARISON:  07/19/2012.  FINDINGS: Normal sized heart. Clear lungs. Old, healed left rib fracture. Bilateral shoulder degenerative changes and superior migration of the humeral heads, greater on the right.  IMPRESSION: 1. No acute abnormality. 2. Bilateral chronic rotator cuff tears and shoulder degenerative changes.   Electronically Signed   By: Claudie Revering M.D.   On: 12/08/2014 00:55    EKG:   Orders placed or performed during the hospital encounter of 12/07/14  . EKG 12-Lead  . EKG 12-Lead    IMPRESSION AND  PLAN:   81 year old Caucasian gentleman with uro ileostomy placement presenting with altered mental status/fever.  1.Sepsis, meeting septic criteria by leukocytosis, temperature present on arrival. Source urinary Code sepsis initiated. Panculture. Broad-spectrum antibiotics including ceftriaxone/vancomycin (initiated emergency department) and taper antibiotics when culture data returns. He has received a 30 mL/kg IV fluid bolus. Continue IV fluid hydration to keep mean arterial pressure greater than 65. He may require pressor therapy if blood pressure worsens. We will repeat lactic acid given the initial is greater than 2.2.  2. Hyponatremia: IV  fluid hydration normal saline follow sodium levels, hold diuretics 3. Hyperlipidemia unspecified: Zocor 4. GERD without esophagitis: PPI therapy 5. Venous thromboembolism prophylactic: Heparin subcutaneous     All the records are reviewed and case discussed with ED provider. Management plans discussed with the patient, family and they are in agreement.  CODE STATUS: DO NOT RESUSCITATE  TOTAL TIME TAKING CARE OF THIS PATIENT: 35 minutes.    Hower,  Karenann Cai.D on 12/08/2014 at 3:32 AM  Between 7am to 6pm - Pager - [phone]  After 6pm: House Pager: - [phone]  [facility]  Office  [phone]  CC: Primary care physician; Kirk Ruths., MD

## 2014-12-09 LAB — BASIC METABOLIC PANEL
Anion gap: 6 (ref 5–15)
BUN: 21 mg/dL — ABNORMAL HIGH (ref 6–20)
CALCIUM: 8 mg/dL — AB (ref 8.9–10.3)
CO2: 22 mmol/L (ref 22–32)
CREATININE: 1.16 mg/dL (ref 0.61–1.24)
Chloride: 98 mmol/L — ABNORMAL LOW (ref 101–111)
GFR calc non Af Amer: 52 mL/min — ABNORMAL LOW (ref >60)
Glucose, Bld: 94 mg/dL (ref 65–99)
Potassium: 3.9 mmol/L (ref 3.5–5.1)
SODIUM: 126 mmol/L — AB (ref 135–145)

## 2014-12-09 LAB — CBC
HCT: 28.2 % — ABNORMAL LOW (ref 40.0–52.0)
Hemoglobin: 9.5 g/dL — ABNORMAL LOW (ref 13.0–18.0)
MCH: 29.2 pg (ref 26.0–34.0)
MCHC: 33.8 g/dL (ref 32.0–36.0)
MCV: 86.4 fL (ref 80.0–100.0)
PLATELETS: 141 10*3/uL — AB (ref 150–440)
RBC: 3.26 MIL/uL — AB (ref 4.40–5.90)
RDW: 15 % — AB (ref 11.5–14.5)
WBC: 10 10*3/uL (ref 3.8–10.6)

## 2014-12-09 LAB — URINE CULTURE

## 2014-12-09 MED ORDER — SODIUM CHLORIDE 1 G PO TABS
1.0000 g | ORAL_TABLET | Freq: Three times a day (TID) | ORAL | Status: DC
  Administered 2014-12-09 – 2014-12-11 (×5): 1 g via ORAL
  Filled 2014-12-09 (×5): qty 1

## 2014-12-09 MED ORDER — HALOPERIDOL LACTATE 5 MG/ML IJ SOLN
1.0000 mg | Freq: Four times a day (QID) | INTRAMUSCULAR | Status: DC | PRN
  Administered 2014-12-09: 1 mg via INTRAVENOUS
  Filled 2014-12-09: qty 1

## 2014-12-09 NOTE — Progress Notes (Signed)
[place] at [place] NAME: Shawn Lyons    MR#:  [id number]  DATE OF BIRTH:  Sep 12, 1919  SUBJECTIVE:  CHIEF COMPLAINT:   Chief Complaint  Patient presents with  . Code Sepsis  confused and no complaint  REVIEW OF SYSTEMS:  The patient is confused and denies any symptoms.  DRUG ALLERGIES:   Allergies  Allergen Reactions  . Augmentin [Amoxicillin-Pot Clavulanate] Nausea Only  . Prilosec [Omeprazole] Diarrhea    VITALS:  Blood pressure 135/59, pulse 95, temperature 98.6 F (37 C), temperature source Oral, resp. rate 18, height 5\' 9"  (1.753 m), weight 64.093 kg (141 lb 4.8 oz), SpO2 97 %.  PHYSICAL EXAMINATION:  GENERAL:  81 y.o.-year-old patient lying in the bed with no acute distress.  EYES: Pupils equal, round, reactive to light and accommodation. No scleral icterus. Extraocular muscles intact.  HEENT: Head atraumatic, normocephalic. Oropharynx and nasopharynx clear.  NECK:  Supple, no jugular venous distention. No thyroid enlargement, no tenderness.  LUNGS: Normal breath sounds bilaterally, no wheezing, rales,rhonchi or crepitation. No use of accessory muscles of respiration.  CARDIOVASCULAR: S1, S2 normal. No murmurs, rubs, or gallops.  ABDOMEN: Soft, nontender, nondistended. Bowel sounds present. No organomegaly or mass.  EXTREMITIES: No pedal edema, cyanosis, or clubbing.  NEUROLOGIC: The patient is confused and follow limited commands, unable to exam. PSYCHIATRIC: The patient is confused SKIN: No obvious rash, lesion, or ulcer.    LABORATORY PANEL:   CBC  Recent Labs Lab 12/09/14 0344  WBC 10.0  HGB 9.5*  HCT 28.2*  PLT 141*   ------------------------------------------------------------------------------------------------------------------  Chemistries   Recent Labs Lab 12/08/14 0010 12/09/14 0344  NA 126* 126*  K 4.3 3.9  CL 93* 98*  CO2 23 22  GLUCOSE 116* 94  BUN 28* 21*  CREATININE 1.46*  1.16  CALCIUM 8.6* 8.0*  AST 24  --   ALT 12*  --   ALKPHOS 72  --   BILITOT 0.9  --    ------------------------------------------------------------------------------------------------------------------  Cardiac Enzymes No results for input(s): TROPONINI in the last 168 hours. ------------------------------------------------------------------------------------------------------------------  RADIOLOGY:  Dg Chest Port 1 View  12/08/2014   CLINICAL DATA:  Fever.  Being treated for pneumonia.  EXAM: PORTABLE CHEST 1 VIEW  COMPARISON:  07/19/2012.  FINDINGS: Normal sized heart. Clear lungs. Old, healed left rib fracture. Bilateral shoulder degenerative changes and superior migration of the humeral heads, greater on the right.  IMPRESSION: 1. No acute abnormality. 2. Bilateral chronic rotator cuff tears and shoulder degenerative changes.   Electronically Signed   By: Claudie Revering M.D.   On: 12/08/2014 00:55    EKG:   Orders placed or performed during the hospital encounter of 12/07/14  . EKG 12-Lead  . EKG 12-Lead    ASSESSMENT AND PLAN:   1.Sepsis with UTI,  Continue ceftriaxone/vancomycin,  follow-up blood and urine culture  * Acute encephalopathy due to sepsis and UTI. The patient is still confused, follow-up and aspiration precaution, Haldol when necessary.  * Acute renal failure. Improved with IV fluid to support.  2. Hyponatremia: Continue IV fluid hydration normal saline follow sodium levels, hold diuretics, follow-up BMP.  3. Hyperlipidemia unspecified: Zocor     All the records are reviewed and case discussed with Care Management/Social Workerr. Management plans discussed with the patient's daughter and they are in agreement.  CODE STATUS: DO NOT RESUSCITATE  TOTAL TIME TAKING CARE OF THIS PATIENT: 39 minutes.   POSSIBLE D/C IN 3 DAYS, DEPENDING  ON CLINICAL CONDITION.   Demetrios Loll M.D on 12/09/2014 at 11:59 AM  Between 7am to 6pm - Pager - [phone]  After  6pm go to [email or web address] - password [email or web address] [facility] [place] [facility]  Office  [phone]  CC: Primary care physician; Kirk Ruths., MD

## 2014-12-09 NOTE — Progress Notes (Signed)
Dr Bridgett Larsson notified of pt continued confusion, pt has few expiratory wheezes, notified  Dr Bridgett Larsson , will input orders

## 2014-12-10 LAB — BASIC METABOLIC PANEL
ANION GAP: 4 — AB (ref 5–15)
BUN: 19 mg/dL (ref 6–20)
CALCIUM: 8.5 mg/dL — AB (ref 8.9–10.3)
CO2: 24 mmol/L (ref 22–32)
CREATININE: 1.05 mg/dL (ref 0.61–1.24)
Chloride: 105 mmol/L (ref 101–111)
GFR calc Af Amer: >60 mL/min (ref >60)
GFR, EST NON AFRICAN AMERICAN: 59 mL/min — AB (ref >60)
GLUCOSE: 100 mg/dL — AB (ref 65–99)
Potassium: 4.2 mmol/L (ref 3.5–5.1)
Sodium: 133 mmol/L — ABNORMAL LOW (ref 135–145)

## 2014-12-10 MED ORDER — VANCOMYCIN HCL IN DEXTROSE 1-5 GM/200ML-% IV SOLN
1000.0000 mg | INTRAVENOUS | Status: DC
  Filled 2014-12-10: qty 200

## 2014-12-10 MED ORDER — IPRATROPIUM-ALBUTEROL 0.5-2.5 (3) MG/3ML IN SOLN
3.0000 mL | Freq: Four times a day (QID) | RESPIRATORY_TRACT | Status: DC
Start: 2014-12-10 — End: 2014-12-11
  Administered 2014-12-10 – 2014-12-11 (×4): 3 mL via RESPIRATORY_TRACT
  Filled 2014-12-10 (×4): qty 3

## 2014-12-10 NOTE — Clinical Social Work Placement (Signed)
   CLINICAL SOCIAL WORK PLACEMENT  NOTE  Date:  12/10/2014  Patient Details  Name: DARA CAMARGO MRN: [id number] Date of Birth: 07-17-1919  Clinical Social Work is seeking post-discharge placement for this patient at the [place] level of care (*CSW will initial, date and re-position this form in  chart as items are completed):  Yes   Patient/family provided with [place] Work Department's list of facilities offering this level of care within the geographic area requested by the patient (or if unable, by the patient's family).  Yes   Patient/family informed of their freedom to choose among providers that offer the needed level of care, that participate in Medicare, Medicaid or managed care program needed by the patient, have an available bed and are willing to accept the patient.  Yes   Patient/family informed of [facility]'s ownership interest in [facility] and [facility], as well as of the fact that they are under no obligation to receive care at these facilities.  PASRR submitted to EDS on 12/10/14     PASRR number received on 12/10/14     Existing PASRR number confirmed on       FL2 transmitted to all facilities in geographic area requested by pt/family on 12/10/14     FL2 transmitted to all facilities within larger geographic area on       Patient informed that his/her managed care company has contracts with or will negotiate with certain facilities, including the following:        Yes   Patient/family informed of bed offers received.  Patient chooses bed at  [facility])     Physician recommends and patient chooses bed at      Patient to be transferred to   on  .  Patient to be transferred to facility by       Patient family notified on   of transfer.  Name of family member notified:        PHYSICIAN Please sign FL2     Additional Comment:    _______________________________________________ Loralyn Freshwater,  LCSW 12/10/2014, 4:57 PM

## 2014-12-10 NOTE — Progress Notes (Signed)
Patient cooperative with care.  Denies any pain other than stiffness in neck.  Resting well and no complaints.  Patient had sitter 8 hours this shift Database administrator).  Pending blood cultures and urine culture.

## 2014-12-10 NOTE — Care Management Important Message (Signed)
Important Message  Patient Details  Name: Shawn Lyons MRN: [id number] Date of Birth: 1919/12/30   Medicare Important Message Given:  Yes-second notification given    Juliann Pulse A Allmond 12/10/2014, 10:44 AM

## 2014-12-10 NOTE — Progress Notes (Signed)
Visit made. Patient is followed by [facility] with a hospice diagnosis of Dementia. He is a DNR code. Patient came to the [facility] on 9/23 for evaluation of altered mental status and cough. He was found to be septic, most likely per chart note review, from a urinary tract infection. Blood cultures are pending. Patient seen lying in bed, alert and interactive, oriented to situation and family present. Staff Rn Maudie Mercury reports patient to be calmer today, he was combative and confused yesterday. Writer spoke with patient's daughter [name] who was at bedside. Patient is currently receiving IV antibiotics. Per CSW Blima Rich patient will be transferring to the [facility] at discharge. He may be going for short term rehab, no decision has been made at this time.  [name] understands that is patient seeks rehab at [facility] he will revoke his hospice benefit. Will continue to follow through final disposition.  Flo Shanks RN, BSN, [place] and [facility] [place], [facility] [phone] c

## 2014-12-10 NOTE — Clinical Social Work Note (Signed)
Clinical Social Work Assessment  Patient Details  Name: Shawn Lyons MRN: [id number] Date of Birth: 09-Mar-1920  Date of referral:  12/10/14               Reason for consult:  Facility Placement, Other (Comment Required) (From [facility])                Permission sought to share information with:  Chartered certified accountant granted to share information::  Yes, Verbal Permission Granted  Name::      [facility]  Agency::     Relationship::     Contact Information:     Housing/Transportation Living arrangements for the past 2 months:  [place] of Information:  [place], Adult Children Patient Interpreter Needed:  None Criminal Activity/Legal Involvement Pertinent to Current Situation/Hospitalization:  No - Comment as needed Significant Relationships:  Adult Children Lives with:  Facility Resident Do you feel safe going back to the place where you live?  Yes Need for family participation in patient care:  Yes (Comment)  Care giving concerns:  Patient is a long term care resident at [facility] followed by [place]/ [facility].    Social Worker assessment / plan: Per Cabin crew at [facility] patient will come to SNF from the hospital and the family will have to decide between rehab under Medicare benefits or pay privately and keep Hospice services. Per Shawn Lyons she will discuss this with family. CSW met with patient who had a Counsellor at bedside. Patient was pleasantly confused. Per sitter she was hired by the family just for the hospital stay. CSW contacted patient's daughter HPOA Shawn Lyons [phone]. Per Shawn Lyons she is in agreement with patient going to [facility]. CSW explained to [facility] that patient will have to go under Medicare for rehab and revoke Hospice services for private pay and continue with hospice services. Daughter verbalized her understanding and will speak to [facility] regarding  payer for SNF.   FL2 complete and on chart.   Employment status:  Disabled (Comment on whether or not currently receiving Disability), Retired Forensic scientist:  Medicare PT Recommendations:  Not assessed at this time Information / Referral to community resources:  [place]  Patient/Family's Response to care:  Daughter is agreeable for patient to go to [facility].   Patient/Family's Understanding of and Emotional Response to Diagnosis, Current Treatment, and Prognosis: Daughter was pleasant and thanked CSW for visit.   Emotional Assessment Appearance:  Appears stated age Attitude/Demeanor/Rapport:    Affect (typically observed):  Accepting, Adaptable, Pleasant Orientation:  Fluctuating Orientation (Suspected and/or reported Sundowners) Alcohol / Substance use:  Not Applicable Psych involvement (Current and /or in the community):  No (Comment)  Discharge Needs  Concerns to be addressed:  Discharge Planning Concerns Readmission within the last 30 days:  No Current discharge risk:  Chronically ill, Cognitively Impaired Barriers to Discharge:  Continued Medical Work up   Shawn Lyons 12/10/2014, 4:51 PM

## 2014-12-10 NOTE — Progress Notes (Signed)
[place] at [place] NAME: Shawn Lyons    MR#:  [id number]  DATE OF BIRTH:  April 17, 1919  SUBJECTIVE:  CHIEF COMPLAINT:   Chief Complaint  Patient presents with  . Code Sepsis  confused and no complaint  REVIEW OF SYSTEMS:  The patient is confused and denies any symptoms.  DRUG ALLERGIES:   Allergies  Allergen Reactions  . Augmentin [Amoxicillin-Pot Clavulanate] Nausea Only  . Prilosec [Omeprazole] Diarrhea    VITALS:  Blood pressure 155/71, pulse 82, temperature 99.8 F (37.7 C), temperature source Oral, resp. rate 18, height 5\' 9"  (1.753 m), weight 64.093 kg (141 lb 4.8 oz), SpO2 98 %.  PHYSICAL EXAMINATION:  GENERAL:  81 y.o.-year-old patient lying in the bed with no acute distress.  EYES: Pupils equal, round, reactive to light and accommodation. No scleral icterus. Extraocular muscles intact.  HEENT: Head atraumatic, normocephalic. Oropharynx and nasopharynx clear.  NECK:  Supple, no jugular venous distention. No thyroid enlargement, no tenderness.  LUNGS: Normal breath sounds bilaterally, mild expiratory wheezing, no rales,rhonchi or crepitation. No use of accessory muscles of respiration.  CARDIOVASCULAR: S1, S2 normal. No murmurs, rubs, or gallops.  ABDOMEN: Soft, nontender, nondistended. Bowel sounds present. No organomegaly or mass.  EXTREMITIES: No pedal edema, cyanosis, or clubbing.  NEUROLOGIC: The patient is confused and follow limited commands, unable to exam. PSYCHIATRIC: The patient is confused, knows his name and location but not time. SKIN: No obvious rash, lesion, or ulcer.    LABORATORY PANEL:   CBC  Recent Labs Lab 12/09/14 0344  WBC 10.0  HGB 9.5*  HCT 28.2*  PLT 141*   ------------------------------------------------------------------------------------------------------------------  Chemistries   Recent Labs Lab 12/08/14 0010  12/10/14 0346  NA 126*  < > 133*  K 4.3  < > 4.2  CL  93*  < > 105  CO2 23  < > 24  GLUCOSE 116*  < > 100*  BUN 28*  < > 19  CREATININE 1.46*  < > 1.05  CALCIUM 8.6*  < > 8.5*  AST 24  --   --   ALT 12*  --   --   ALKPHOS 72  --   --   BILITOT 0.9  --   --   < > = values in this interval not displayed. ------------------------------------------------------------------------------------------------------------------  Cardiac Enzymes No results for input(s): TROPONINI in the last 168 hours. ------------------------------------------------------------------------------------------------------------------  RADIOLOGY:  No results found.  EKG:   Orders placed or performed during the hospital encounter of 12/07/14  . EKG 12-Lead  . EKG 12-Lead    ASSESSMENT AND PLAN:   1.Sepsis with UTI,  Continue ceftriaxone/vancomycin,  follow-up blood and urine culture  * Acute encephalopathy due to sepsis and UTI.  Fall and aspiration precaution, Haldol when necessary.  * Acute renal failure. Improved, discontinue IV fluid support.   2. Hyponatremia: Improving, discontinue IV fluid, hold diuretics, follow-up BMP.  3. Hyperlipidemia unspecified: Zocor  Wheezing. Duoneb. Discontinued IV fluid.   All the records are reviewed and case discussed with Care Management/Social Workerr. Management plans discussed with the patient's daughter and they are in agreement.  CODE STATUS: DO NOT RESUSCITATE  TOTAL TIME TAKING CARE OF THIS PATIENT: 38 minutes.   POSSIBLE D/C IN 3 DAYS, DEPENDING ON CLINICAL CONDITION.   Demetrios Loll M.D on 12/10/2014 at 2:00 PM  Between 7am to 6pm - Pager - [phone]  After 6pm go to [email or web address] - password [email or web address] [facility] Hospitalists  Office  [phone]  CC: Primary care physician; Kirk Ruths., MD

## 2014-12-10 NOTE — Progress Notes (Signed)
ANTIBIOTIC CONSULT NOTE - INITIAL  Pharmacy Consult for vancomycin and ceftriaxone dosing Indication: sepsis  Allergies  Allergen Reactions  . Augmentin [Amoxicillin-Pot Clavulanate] Nausea Only  . Prilosec [Omeprazole] Diarrhea    Patient Measurements: Height: 5\' 9"  (175.3 cm) Weight: 141 lb 4.8 oz (64.093 kg) IBW/kg (Calculated) : 70.7  Vital Signs: Temp: 99.8 F (37.7 C) (09/26 0814) Temp Source: Oral (09/26 0814) BP: 155/71 mmHg (09/26 0814) Pulse Rate: 82 (09/26 0814)  Labs:  Recent Labs  12/08/14 0010 12/09/14 0344 12/10/14 0346  WBC 15.5* 10.0  --   HGB 9.7* 9.5*  --   PLT 181 141*  --   CREATININE 1.46* 1.16 1.05   Estimated Creatinine Clearance: 39.9 mL/min (by C-G formula based on Cr of 1.05). No results for input(s): VANCOTROUGH, VANCOPEAK, VANCORANDOM, GENTTROUGH, GENTPEAK, GENTRANDOM, TOBRATROUGH, TOBRAPEAK, TOBRARND, AMIKACINPEAK, AMIKACINTROU, AMIKACIN in the last 72 hours.   Microbiology: Recent Results (from the past 720 hour(s))  Urine culture     Status: None   Collection Time: 12/08/14  1:05 AM  Result Value Ref Range Status   Specimen Description URINE, RANDOM  Final   Special Requests NONE  Final   Culture MULTIPLE SPECIES PRESENT, SUGGEST RECOLLECTION  Final   Report Status 12/09/2014 FINAL  Final  Blood Culture (routine x 2)     Status: None (Preliminary result)   Collection Time: 12/08/14  4:45 AM  Result Value Ref Range Status   Specimen Description BLOOD RIGHT HAND  Final   Special Requests BOTTLES DRAWN AEROBIC AND ANAEROBIC 3CC  Final   Culture NO GROWTH 2 DAYS  Final   Report Status PENDING  Incomplete  MRSA PCR Screening     Status: None   Collection Time: 12/08/14  4:45 AM  Result Value Ref Range Status   MRSA by PCR NEGATIVE NEGATIVE Final    Comment:        The GeneXpert MRSA Assay (FDA approved for NASAL specimens only), is one component of a comprehensive MRSA colonization surveillance program. It is not intended to  diagnose MRSA infection nor to guide or monitor treatment for MRSA infections.   Blood Culture (routine x 2)     Status: None (Preliminary result)   Collection Time: 12/08/14  4:55 AM  Result Value Ref Range Status   Specimen Description BLOOD LEFT HAND  Final   Special Requests BOTTLES DRAWN AEROBIC AND ANAEROBIC 3CC  Final   Culture NO GROWTH 2 DAYS  Final   Report Status PENDING  Incomplete    Assessment: Pharmacy consulted to dose vancomycin and ceftriaxone in this 81 year old male with urosepsis. Urine cultures with multiple species, blood cultures no growth to date. Renal function improved.  Pk parameters  Estimated 9/26 SCr 1.05, CrCl: 68ml/min Ke: 0.0376, t1/2: 18h, Vd: 45L  Goal of Therapy:  Vancomycin trough level 15-20 mcg/ml  Plan:  Current orders for vancomycin 1250mg  IV Q36H. Renal function improved, will redose to target trough of 15-62mcg/ml. Orders changed to 1gm IV Q24H. Will check trough prior to third dose of this regimen as this will be day 4 of vancomycin. Will not be quite steady state of this regimen.  Continue ceftriaxone 2gm IV Q24H.  Pharmacy to follow per consult.   Rexene Edison, PharmD Clinical Pharmacist 12/10/2014 9:39 AM

## 2014-12-11 DIAGNOSIS — A419 Sepsis, unspecified organism: Secondary | ICD-10-CM

## 2014-12-11 DIAGNOSIS — I2510 Atherosclerotic heart disease of native coronary artery without angina pectoris: Secondary | ICD-10-CM

## 2014-12-11 DIAGNOSIS — F3341 Major depressive disorder, recurrent, in partial remission: Secondary | ICD-10-CM

## 2014-12-11 DIAGNOSIS — K219 Gastro-esophageal reflux disease without esophagitis: Secondary | ICD-10-CM

## 2014-12-11 DIAGNOSIS — Z936 Other artificial openings of urinary tract status: Secondary | ICD-10-CM | POA: Diagnosis not present

## 2014-12-11 DIAGNOSIS — F0150 Vascular dementia without behavioral disturbance: Secondary | ICD-10-CM

## 2014-12-11 MED ORDER — CIPROFLOXACIN HCL 500 MG PO TABS: 500.0000 mg | ORAL_TABLET | Freq: Two times a day (BID) | ORAL | Status: DC

## 2014-12-11 NOTE — Progress Notes (Signed)
Called report to [facility] skilled facility.  Patient going to room 228.  Gave report to [facility].  Called for non emergent transportation.

## 2014-12-11 NOTE — Clinical Social Work Placement (Signed)
   CLINICAL SOCIAL WORK PLACEMENT  NOTE  Date:  12/11/2014  Patient Details  Name: Shawn Lyons MRN: [id number] Date of Birth: 1919/08/14  Clinical Social Work is seeking post-discharge placement for this patient at the [place] level of care (*CSW will initial, date and re-position this form in  chart as items are completed):  Yes   Patient/family provided with [place] Work Department's list of facilities offering this level of care within the geographic area requested by the patient (or if unable, by the patient's family).  Yes   Patient/family informed of their freedom to choose among providers that offer the needed level of care, that participate in Medicare, Medicaid or managed care program needed by the patient, have an available bed and are willing to accept the patient.  Yes   Patient/family informed of [place]'s ownership interest in [facility] and [facility], as well as of the fact that they are under no obligation to receive care at these facilities.  PASRR submitted to EDS on 12/10/14     PASRR number received on 12/10/14     Existing PASRR number confirmed on       FL2 transmitted to all facilities in geographic area requested by pt/family on 12/10/14     FL2 transmitted to all facilities within larger geographic area on       Patient informed that his/her managed care company has contracts with or will negotiate with certain facilities, including the following:        Yes   Patient/family informed of bed offers received.  Patient chooses bed at  [facility])     Physician recommends and patient chooses bed at      Patient to be transferred to  [facility] ) on 12/11/14.  Patient to be transferred to facility by  [facility] )     Patient family notified on 12/11/14 of transfer.  Name of family member notified:   (Daughter [name] aware of D/C. )     PHYSICIAN       Additional Comment:     _______________________________________________ Loralyn Freshwater, LCSW 12/11/2014, 10:51 AM

## 2014-12-11 NOTE — Progress Notes (Signed)
Visit made. Patient seen sitting up in bed, alert and interactive. Staff RN Maudie Mercury present. Per call from Kathleen is for patient to discharge today via EMS to [place]. He will continue hospice services per family request. Hospice team updated. Thank you. Flo Shanks Rn, BSN, [place] and [facility] of [place], [facility] [phone] c

## 2014-12-11 NOTE — Discharge Instructions (Signed)
Heart healthy diet. °Activity as tolerated. °

## 2014-12-11 NOTE — Progress Notes (Signed)
Patient is medically stable for D/C to [facility] today. Per [name] admissions coordinator at [facility] patient is going to room 228. RN will call report at [phone] and arrange EMS for transport. Clinical Education officer, museum (CSW) prepared D/C packet and sent D/C Summary to [employer] via carefinder. CSW contacted patient's daughter [name]. Per [name] patient will remain under hospice services. CSW left voicemail with [facility] liaison making her aware of above. Please reconsult if future social work needs arise. CSW signing off.   Blima Rich, [place] [phone]

## 2014-12-11 NOTE — Discharge Summary (Signed)
[name] NAME: Shawn Lyons    MR#:  [id number]  DATE OF BIRTH:  Jun 11, 1919  DATE OF ADMISSION:  12/07/2014 ADMITTING PHYSICIAN: Lytle Butte, MD  DATE OF DISCHARGE: 12/11/2014  PRIMARY CARE PHYSICIAN: Kirk Ruths., MD    ADMISSION DIAGNOSIS:  Hyponatremia [E87.1] Sepsis due to undetermined organism without resultant organ failure [A41.9]   DISCHARGE DIAGNOSIS:  Sepsis with UTI Acute encephalopathy Acute renal failure  SECONDARY DIAGNOSIS:   Past Medical History  Diagnosis Date  . Coronary artery disease   . Arthritis   . Depression   . Vitamin B12 deficiency     HOSPITAL COURSE:   1.Sepsis with UTI,  He has been treated withceftriaxone/vancomycin.  Blood cultures are negative and urine culture is too young to read. Change to cipro po after discharge.  * Acute encephalopathy due to sepsis and UTI. improved. Fall and aspiration precaution.   * Acute renal failure. Improved with IV fluid support.   2. Hyponatremia: Improved  3. Hyperlipidemia unspecified: Zocor  Wheezing. Resolved after discontinuing IV fluid.   DISCHARGE CONDITIONS:   Stable, discharge to SNF today.  CONSULTS OBTAINED:  Treatment Team:  Lytle Butte, MD  DRUG ALLERGIES:   Allergies  Allergen Reactions  . Augmentin [Amoxicillin-Pot Clavulanate] Nausea Only  . Prilosec [Omeprazole] Diarrhea    DISCHARGE MEDICATIONS:   Current Discharge Medication List    START taking these medications   Details  ciprofloxacin (CIPRO) 500 MG tablet Take 1 tablet (500 mg total) by mouth 2 (two) times daily. Qty: 3 tablet, Refills: 0      CONTINUE these medications which have NOT CHANGED   Details  acetaminophen (TYLENOL) 500 MG tablet Take 1,000 mg by mouth every 4 (four) hours as needed for moderate pain (max of 4059m per 24  hours).    albuterol (PROVENTIL HFA;VENTOLIN HFA) 108 (90 BASE) MCG/ACT inhaler Inhale 1 puff  into the lungs every 4 (four) hours as needed for wheezing.    aspirin EC 81 MG tablet Take 81 mg by mouth at bedtime.     cholecalciferol (VITAMIN D) 1000 UNITS tablet Take 1,000 Units by mouth daily.    Cyanocobalamin 1000 MCG/ML KIT Inject 1 mL as directed every 30 (thirty) days. Around the 28th day of the month    diclofenac sodium (VOLTAREN) 1 % GEL Apply 4 g topically 4 (four) times daily. Apply to neck    DiphenhydrAMINE HCl, Sleep, 50 MG CAPS Take 1 capsule by mouth at bedtime.    DULoxetine (CYMBALTA) 60 MG capsule Take 60 mg by mouth daily.    furosemide (LASIX) 20 MG tablet Take 20 mg by mouth daily.     guaiFENesin-dextromethorphan (ROBITUSSIN DM) 100-10 MG/5ML syrup Take 5 mLs by mouth every 4 (four) hours as needed for cough.    lansoprazole (PREVACID) 15 MG capsule Take 15 mg by mouth daily at 12 noon.    loperamide (IMODIUM) 2 MG capsule Take 2 mg by mouth every 12 (twelve) hours as needed for diarrhea or loose stools.    mirtazapine (REMERON) 30 MG tablet Take 30 mg by mouth at bedtime.     polyethylene glycol (MIRALAX / GLYCOLAX) packet Take 17 g by mouth daily as needed.     simvastatin (ZOCOR) 40 MG tablet Take 40 mg by mouth at bedtime.    Soft Lens Products (SENSITIVE EYES SALINE) SOLN Place 1 drop into both eyes as needed (itchy or dry eyes).  DISCHARGE INSTRUCTIONS:   If you experience worsening of your admission symptoms, develop shortness of breath, life threatening emergency, suicidal or homicidal thoughts you must seek medical attention immediately by calling 911 or calling your MD immediately  if symptoms less severe.  You Must read complete instructions/literature along with all the possible adverse reactions/side effects for all the Medicines you take and that have been prescribed to you. Take any new Medicines after you have completely understood and accept all the possible adverse reactions/side effects.   Please note  You were cared  for by a hospitalist during your hospital stay. If you have any questions about your discharge medications or the care you received while you were in the hospital after you are discharged, you can call the unit and asked to speak with the hospitalist on call if the hospitalist that took care of you is not available. Once you are discharged, your primary care physician will handle any further medical issues. Please note that NO REFILLS for any discharge medications will be authorized once you are discharged, as it is imperative that you return to your primary care physician (or establish a relationship with a primary care physician if you do not have one) for your aftercare needs so that they can reassess your need for medications and monitor your lab values.    Today   SUBJECTIVE   No complaint.   VITAL SIGNS:  Blood pressure 135/58, pulse 82, temperature 98.7 F (37.1 C), temperature source Oral, resp. rate 18, height 5' 9"  (1.753 m), weight 64.093 kg (141 lb 4.8 oz), SpO2 97 %.  I/O:   Intake/Output Summary (Last 24 hours) at 12/11/14 1010 Last data filed at 12/11/14 0800  Gross per 24 hour  Intake    600 ml  Output   2225 ml  Net  -1625 ml    PHYSICAL EXAMINATION:  GENERAL:  81 y.o.-year-old patient lying in the bed with no acute distress.  EYES: Pupils equal, round, reactive to light and accommodation. No scleral icterus. Extraocular muscles intact.  HEENT: Head atraumatic, normocephalic. Oropharynx and nasopharynx clear.  NECK:  Supple, no jugular venous distention. No thyroid enlargement, no tenderness.  LUNGS: Normal breath sounds bilaterally, no wheezing, rales,rhonchi or crepitation. No use of accessory muscles of respiration.  CARDIOVASCULAR: S1, S2 normal. No murmurs, rubs, or gallops.  ABDOMEN: Soft, non-tender, non-distended. Bowel sounds present. No organomegaly or mass.  EXTREMITIES: No pedal edema, cyanosis, or clubbing.  NEUROLOGIC: Cranial nerves II through XII are  intact. Muscle strength 5/5 in all extremities. Sensation intact. Gait not checked.  PSYCHIATRIC: The patient is alert and oriented x 2.  SKIN: No obvious rash, lesion, or ulcer.   DATA REVIEW:   CBC  Recent Labs Lab 12/09/14 0344  WBC 10.0  HGB 9.5*  HCT 28.2*  PLT 141*    Chemistries   Recent Labs Lab 12/08/14 0010  12/10/14 0346  NA 126*  < > 133*  K 4.3  < > 4.2  CL 93*  < > 105  CO2 23  < > 24  GLUCOSE 116*  < > 100*  BUN 28*  < > 19  CREATININE 1.46*  < > 1.05  CALCIUM 8.6*  < > 8.5*  AST 24  --   --   ALT 12*  --   --   ALKPHOS 72  --   --   BILITOT 0.9  --   --   < > = values in this interval not displayed.  Cardiac Enzymes No results for input(s): TROPONINI in the last 168 hours.  Microbiology Results  Results for orders placed or performed during the hospital encounter of 12/07/14  Urine culture     Status: None   Collection Time: 12/08/14  1:05 AM  Result Value Ref Range Status   Specimen Description URINE, RANDOM  Final   Special Requests NONE  Final   Culture MULTIPLE SPECIES PRESENT, SUGGEST RECOLLECTION  Final   Report Status 12/09/2014 FINAL  Final  Blood Culture (routine x 2)     Status: None (Preliminary result)   Collection Time: 12/08/14  4:45 AM  Result Value Ref Range Status   Specimen Description BLOOD RIGHT HAND  Final   Special Requests BOTTLES DRAWN AEROBIC AND ANAEROBIC 3CC  Final   Culture NO GROWTH 2 DAYS  Final   Report Status PENDING  Incomplete  MRSA PCR Screening     Status: None   Collection Time: 12/08/14  4:45 AM  Result Value Ref Range Status   MRSA by PCR NEGATIVE NEGATIVE Final    Comment:        The GeneXpert MRSA Assay (FDA approved for NASAL specimens only), is one component of a comprehensive MRSA colonization surveillance program. It is not intended to diagnose MRSA infection nor to guide or monitor treatment for MRSA infections.   Blood Culture (routine x 2)     Status: None (Preliminary result)    Collection Time: 12/08/14  4:55 AM  Result Value Ref Range Status   Specimen Description BLOOD LEFT HAND  Final   Special Requests BOTTLES DRAWN AEROBIC AND ANAEROBIC 3CC  Final   Culture NO GROWTH 2 DAYS  Final   Report Status PENDING  Incomplete  Urine culture     Status: None (Preliminary result)   Collection Time: 12/09/14 12:27 PM  Result Value Ref Range Status   Specimen Description URINE, RANDOM  Final   Special Requests NONE  Final   Culture TOO YOUNG TO READ  Final   Report Status PENDING  Incomplete    RADIOLOGY:  No results found.      Management plans discussed with the patient, his daughter and they are in agreement.  CODE STATUS:     Code Status Orders        Start     Ordered   12/08/14 0311  Do not attempt resuscitation (DNR)   Continuous    Question Answer Comment  In the event of cardiac or respiratory ARREST Do not call a "code blue"   In the event of cardiac or respiratory ARREST Do not perform Intubation, CPR, defibrillation or ACLS   In the event of cardiac or respiratory ARREST Use medication by any route, position, wound care, and other measures to relive pain and suffering. May use oxygen, suction and manual treatment of airway obstruction as needed for comfort.      12/08/14 0310    Advance Directive Documentation        Most Recent Value   Type of Advance Directive  Healthcare Power of Attorney   Pre-existing out of facility DNR order (yellow form or pink MOST form)  Yellow form placed in chart (order not valid for inpatient use)   "MOST" Form in Place?        TOTAL TIME TAKING CARE OF THIS PATIENT: 35 minutes.    Demetrios Loll M.D on 12/11/2014 at 10:10 AM  Between 7am to 6pm - Pager - [phone]  After 6pm go to [email or web address] -  password [email or web address] [facility]  [place] Hospitalists  Office  [phone]  CC: Primary care physician; Kirk Ruths., MD

## 2014-12-12 LAB — URINE CULTURE

## 2014-12-13 LAB — CULTURE, BLOOD (ROUTINE X 2)
CULTURE: NO GROWTH
Culture: NO GROWTH

## 2014-12-27 DIAGNOSIS — Z936 Other artificial openings of urinary tract status: Secondary | ICD-10-CM | POA: Diagnosis not present

## 2014-12-27 DIAGNOSIS — I2510 Atherosclerotic heart disease of native coronary artery without angina pectoris: Secondary | ICD-10-CM | POA: Diagnosis not present

## 2014-12-27 DIAGNOSIS — F339 Major depressive disorder, recurrent, unspecified: Secondary | ICD-10-CM

## 2014-12-27 DIAGNOSIS — K219 Gastro-esophageal reflux disease without esophagitis: Secondary | ICD-10-CM | POA: Diagnosis not present

## 2014-12-27 DIAGNOSIS — F0150 Vascular dementia without behavioral disturbance: Secondary | ICD-10-CM | POA: Diagnosis not present

## 2015-02-01 DIAGNOSIS — I2510 Atherosclerotic heart disease of native coronary artery without angina pectoris: Secondary | ICD-10-CM | POA: Diagnosis not present

## 2015-02-01 DIAGNOSIS — F0150 Vascular dementia without behavioral disturbance: Secondary | ICD-10-CM

## 2015-02-01 DIAGNOSIS — K219 Gastro-esophageal reflux disease without esophagitis: Secondary | ICD-10-CM | POA: Diagnosis not present

## 2015-02-01 DIAGNOSIS — F323 Major depressive disorder, single episode, severe with psychotic features: Secondary | ICD-10-CM

## 2015-02-01 DIAGNOSIS — R609 Edema, unspecified: Secondary | ICD-10-CM | POA: Diagnosis not present

## 2015-02-26 DIAGNOSIS — I2510 Atherosclerotic heart disease of native coronary artery without angina pectoris: Secondary | ICD-10-CM

## 2015-02-26 DIAGNOSIS — R05 Cough: Secondary | ICD-10-CM

## 2015-02-26 DIAGNOSIS — F324 Major depressive disorder, single episode, in partial remission: Secondary | ICD-10-CM

## 2015-02-26 DIAGNOSIS — F0150 Vascular dementia without behavioral disturbance: Secondary | ICD-10-CM | POA: Diagnosis not present

## 2015-02-26 DIAGNOSIS — K219 Gastro-esophageal reflux disease without esophagitis: Secondary | ICD-10-CM

## 2015-04-01 DIAGNOSIS — L723 Sebaceous cyst: Secondary | ICD-10-CM | POA: Diagnosis not present

## 2015-04-09 DIAGNOSIS — L02212 Cutaneous abscess of back [any part, except buttock]: Secondary | ICD-10-CM

## 2015-05-10 DIAGNOSIS — I2510 Atherosclerotic heart disease of native coronary artery without angina pectoris: Secondary | ICD-10-CM | POA: Diagnosis not present

## 2015-05-10 DIAGNOSIS — Z936 Other artificial openings of urinary tract status: Secondary | ICD-10-CM | POA: Diagnosis not present

## 2015-05-10 DIAGNOSIS — F0150 Vascular dementia without behavioral disturbance: Secondary | ICD-10-CM | POA: Diagnosis not present

## 2015-05-10 DIAGNOSIS — K219 Gastro-esophageal reflux disease without esophagitis: Secondary | ICD-10-CM | POA: Diagnosis not present

## 2015-05-16 DIAGNOSIS — I25119 Atherosclerotic heart disease of native coronary artery with unspecified angina pectoris: Secondary | ICD-10-CM | POA: Diagnosis not present

## 2015-05-16 DIAGNOSIS — R0902 Hypoxemia: Secondary | ICD-10-CM | POA: Diagnosis not present

## 2015-05-23 DIAGNOSIS — J209 Acute bronchitis, unspecified: Secondary | ICD-10-CM | POA: Diagnosis not present

## 2015-07-09 DIAGNOSIS — K219 Gastro-esophageal reflux disease without esophagitis: Secondary | ICD-10-CM | POA: Diagnosis not present

## 2015-07-09 DIAGNOSIS — Z936 Other artificial openings of urinary tract status: Secondary | ICD-10-CM | POA: Diagnosis not present

## 2015-07-09 DIAGNOSIS — I2510 Atherosclerotic heart disease of native coronary artery without angina pectoris: Secondary | ICD-10-CM | POA: Diagnosis not present

## 2015-07-09 DIAGNOSIS — F323 Major depressive disorder, single episode, severe with psychotic features: Secondary | ICD-10-CM

## 2015-07-09 DIAGNOSIS — F0150 Vascular dementia without behavioral disturbance: Secondary | ICD-10-CM | POA: Diagnosis not present

## 2015-09-04 DIAGNOSIS — K219 Gastro-esophageal reflux disease without esophagitis: Secondary | ICD-10-CM

## 2015-09-04 DIAGNOSIS — I2510 Atherosclerotic heart disease of native coronary artery without angina pectoris: Secondary | ICD-10-CM | POA: Diagnosis not present

## 2015-09-04 DIAGNOSIS — F0150 Vascular dementia without behavioral disturbance: Secondary | ICD-10-CM | POA: Diagnosis not present

## 2015-09-04 DIAGNOSIS — F323 Major depressive disorder, single episode, severe with psychotic features: Secondary | ICD-10-CM

## 2015-09-04 DIAGNOSIS — Z936 Other artificial openings of urinary tract status: Secondary | ICD-10-CM | POA: Diagnosis not present

## 2015-10-01 DIAGNOSIS — L03314 Cellulitis of groin: Secondary | ICD-10-CM

## 2015-10-29 DIAGNOSIS — L723 Sebaceous cyst: Secondary | ICD-10-CM

## 2015-11-11 ENCOUNTER — Telehealth: Payer: Self-pay

## 2015-11-11 ENCOUNTER — Encounter: Payer: Self-pay | Admitting: Emergency Medicine

## 2015-11-11 ENCOUNTER — Inpatient Hospital Stay
Admission: EM | Admit: 2015-11-11 | Discharge: 2015-11-15 | DRG: 698 | Disposition: A | Discharge status: Home / Self Care | Payer: Medicare Other | Attending: Internal Medicine | Admitting: Internal Medicine

## 2015-11-11 ENCOUNTER — Emergency Department: Payer: Medicare Other

## 2015-11-11 DIAGNOSIS — D62 Acute posthemorrhagic anemia: Secondary | ICD-10-CM | POA: Diagnosis present

## 2015-11-11 DIAGNOSIS — I2510 Atherosclerotic heart disease of native coronary artery without angina pectoris: Secondary | ICD-10-CM | POA: Diagnosis present

## 2015-11-11 DIAGNOSIS — E860 Dehydration: Secondary | ICD-10-CM | POA: Diagnosis present

## 2015-11-11 DIAGNOSIS — R000 Tachycardia, unspecified: Secondary | ICD-10-CM | POA: Diagnosis present

## 2015-11-11 DIAGNOSIS — I959 Hypotension, unspecified: Secondary | ICD-10-CM | POA: Diagnosis present

## 2015-11-11 DIAGNOSIS — Z8249 Family history of ischemic heart disease and other diseases of the circulatory system: Secondary | ICD-10-CM

## 2015-11-11 DIAGNOSIS — M1990 Unspecified osteoarthritis, unspecified site: Secondary | ICD-10-CM | POA: Diagnosis present

## 2015-11-11 DIAGNOSIS — K922 Gastrointestinal hemorrhage, unspecified: Secondary | ICD-10-CM | POA: Diagnosis present

## 2015-11-11 DIAGNOSIS — Z87891 Personal history of nicotine dependence: Secondary | ICD-10-CM

## 2015-11-11 DIAGNOSIS — N179 Acute kidney failure, unspecified: Secondary | ICD-10-CM | POA: Diagnosis present

## 2015-11-11 DIAGNOSIS — A419 Sepsis, unspecified organism: Secondary | ICD-10-CM | POA: Diagnosis present

## 2015-11-11 DIAGNOSIS — N390 Urinary tract infection, site not specified: Secondary | ICD-10-CM | POA: Diagnosis present

## 2015-11-11 DIAGNOSIS — K210 Gastro-esophageal reflux disease with esophagitis: Secondary | ICD-10-CM | POA: Diagnosis present

## 2015-11-11 DIAGNOSIS — T83511A Infection and inflammatory reaction due to indwelling urethral catheter, initial encounter: Principal | ICD-10-CM | POA: Diagnosis present

## 2015-11-11 DIAGNOSIS — Z8551 Personal history of malignant neoplasm of bladder: Secondary | ICD-10-CM

## 2015-11-11 DIAGNOSIS — Z66 Do not resuscitate: Secondary | ICD-10-CM | POA: Diagnosis present

## 2015-11-11 DIAGNOSIS — E538 Deficiency of other specified B group vitamins: Secondary | ICD-10-CM | POA: Diagnosis present

## 2015-11-11 DIAGNOSIS — N183 Chronic kidney disease, stage 3 (moderate): Secondary | ICD-10-CM | POA: Diagnosis present

## 2015-11-11 DIAGNOSIS — R112 Nausea with vomiting, unspecified: Secondary | ICD-10-CM

## 2015-11-11 DIAGNOSIS — K920 Hematemesis: Secondary | ICD-10-CM | POA: Diagnosis present

## 2015-11-11 DIAGNOSIS — K921 Melena: Secondary | ICD-10-CM | POA: Diagnosis present

## 2015-11-11 DIAGNOSIS — Z888 Allergy status to other drugs, medicaments and biological substances status: Secondary | ICD-10-CM

## 2015-11-11 DIAGNOSIS — I9589 Other hypotension: Secondary | ICD-10-CM

## 2015-11-11 DIAGNOSIS — F0280 Dementia in other diseases classified elsewhere without behavioral disturbance: Secondary | ICD-10-CM | POA: Diagnosis present

## 2015-11-11 DIAGNOSIS — E871 Hypo-osmolality and hyponatremia: Secondary | ICD-10-CM | POA: Diagnosis present

## 2015-11-11 DIAGNOSIS — Z79899 Other long term (current) drug therapy: Secondary | ICD-10-CM

## 2015-11-11 DIAGNOSIS — Z7982 Long term (current) use of aspirin: Secondary | ICD-10-CM

## 2015-11-11 DIAGNOSIS — E785 Hyperlipidemia, unspecified: Secondary | ICD-10-CM | POA: Diagnosis present

## 2015-11-11 DIAGNOSIS — G309 Alzheimer's disease, unspecified: Secondary | ICD-10-CM | POA: Diagnosis present

## 2015-11-11 HISTORY — DX: Malignant (primary) neoplasm, unspecified: C80.1

## 2015-11-11 LAB — CBC
HCT: 26.6 % — ABNORMAL LOW (ref 40.0–52.0)
Hemoglobin: 9.1 g/dL — ABNORMAL LOW (ref 13.0–18.0)
MCH: 29.9 pg (ref 26.0–34.0)
MCHC: 34 g/dL (ref 32.0–36.0)
MCV: 87.8 fL (ref 80.0–100.0)
PLATELETS: 320 10*3/uL (ref 150–440)
RBC: 3.03 MIL/uL — AB (ref 4.40–5.90)
RDW: 15.3 % — ABNORMAL HIGH (ref 11.5–14.5)
WBC: 9.7 10*3/uL (ref 3.8–10.6)

## 2015-11-11 LAB — URINALYSIS COMPLETE WITH MICROSCOPIC (ARMC ONLY)
BILIRUBIN URINE: NEGATIVE
GLUCOSE, UA: NEGATIVE mg/dL
Hgb urine dipstick: NEGATIVE
KETONES UR: NEGATIVE mg/dL
Nitrite: POSITIVE — AB
Protein, ur: NEGATIVE mg/dL
SQUAMOUS EPITHELIAL / LPF: NONE SEEN
Specific Gravity, Urine: 1.013 (ref 1.005–1.030)
pH: 6 (ref 5.0–8.0)

## 2015-11-11 LAB — COMPREHENSIVE METABOLIC PANEL
ALK PHOS: 68 U/L (ref 38–126)
ALT: 14 U/L — AB (ref 17–63)
AST: 23 U/L (ref 15–41)
Albumin: 3.3 g/dL — ABNORMAL LOW (ref 3.5–5.0)
Anion gap: 10 (ref 5–15)
BILIRUBIN TOTAL: 0.3 mg/dL (ref 0.3–1.2)
BUN: 81 mg/dL — AB (ref 6–20)
CALCIUM: 8.9 mg/dL (ref 8.9–10.3)
CHLORIDE: 107 mmol/L (ref 101–111)
CO2: 17 mmol/L — ABNORMAL LOW (ref 22–32)
CREATININE: 1.51 mg/dL — AB (ref 0.61–1.24)
GFR, EST AFRICAN AMERICAN: 44 mL/min — AB (ref >60)
GFR, EST NON AFRICAN AMERICAN: 38 mL/min — AB (ref >60)
Glucose, Bld: 132 mg/dL — ABNORMAL HIGH (ref 65–99)
Potassium: 4.6 mmol/L (ref 3.5–5.1)
Sodium: 134 mmol/L — ABNORMAL LOW (ref 135–145)
TOTAL PROTEIN: 6.3 g/dL — AB (ref 6.5–8.1)

## 2015-11-11 LAB — LIPASE, BLOOD: LIPASE: 18 U/L (ref 11–51)

## 2015-11-11 MED ORDER — FAMOTIDINE IN NACL 20-0.9 MG/50ML-% IV SOLN
20.0000 mg | Freq: Once | INTRAVENOUS | Status: AC
  Administered 2015-11-12: 20 mg via INTRAVENOUS
  Filled 2015-11-11: qty 50

## 2015-11-11 MED ORDER — SODIUM CHLORIDE 0.9 % IV SOLN
Freq: Once | INTRAVENOUS | Status: AC
  Administered 2015-11-11: 22:00:00 via INTRAVENOUS

## 2015-11-11 MED ORDER — IOPAMIDOL (ISOVUE-300) INJECTION 61%
75.0000 mL | Freq: Once | INTRAVENOUS | Status: AC | PRN
  Administered 2015-11-11: 75 mL via INTRAVENOUS

## 2015-11-11 MED ORDER — DIATRIZOATE MEGLUMINE & SODIUM 66-10 % PO SOLN: 15.0000 mL | Freq: Once | ORAL | Status: DC

## 2015-11-11 MED ORDER — ONDANSETRON HCL 4 MG/2ML IJ SOLN
4.0000 mg | Freq: Once | INTRAMUSCULAR | Status: AC
  Administered 2015-11-11: 4 mg via INTRAVENOUS

## 2015-11-11 MED ORDER — ONDANSETRON HCL 4 MG/2ML IJ SOLN
INTRAMUSCULAR | Status: AC
  Administered 2015-11-11: 4 mg via INTRAVENOUS
  Filled 2015-11-11: qty 2

## 2015-11-11 NOTE — ED Triage Notes (Signed)
Pt arrived by EMS from [facility] with facility reporting pt has had "yellow/orange" stools and emesis that started earlier today. Facility reported pt was hypotensive 80/48, was given 566ml, when EMS arrived to facility pts BP was 104/55. Pt has HX of dementia and foley catheter in place.

## 2015-11-11 NOTE — Telephone Encounter (Signed)
PLEASE NOTE: All timestamps contained within this report are represented as [place] Standard Time. CONFIDENTIALTY NOTICE: This fax transmission is intended only for the addressee. It contains information that is legally privileged, confidential or otherwise protected from use or disclosure. If you are not the intended recipient, you are strictly prohibited from reviewing, disclosing, copying using or disseminating any of this information or taking any action in reliance on or regarding this information. If you have received this fax in error, please notify us immediately by telephone so that we can arrange for its return to [place]. Phone: [phone], Toll-Free: [phone], Fax: [phone] Page: 1 of 1 Call Id: [id number] [place] Night - Client Nonclinical Telephone Record [place] Night - Client Client Site [place] Physician Viviana Simpler - MD Contact Type Call Who Is Calling Physician / Provider / Hospital Call Type Provider Call [facility] Page Now Reason for Call Request to speak to Physician Initial Comment Tom RN from [place] and he is needing to speak with the on-call about a mutual patient. CB# [id number] Additional Comment Patient Name Shawn Lyons Patient DOB 25-Mar-1919 Requesting Provider Gershon Mussel RN Physician Number [id number] Facility Name [facility] Paging DoctorName Phone DateTime Result/Outcome Message Type Notes Garret Reddish - MD [id number] 11/10/2015 9:28:01 PM Called On Call Provider - Reached Doctor Paged Garret Reddish - MD 11/10/2015 9:28:47 PM Spoke with On Call - General Message Result Connected to facility. Call Closed By: Candy Sledge Transaction Date/Time: 11/10/2015 9:14:07 PM (ET)

## 2015-11-11 NOTE — Telephone Encounter (Signed)
I saw him today Was having some vomiting Orders given there

## 2015-11-12 ENCOUNTER — Encounter: Payer: Self-pay | Admitting: Internal Medicine

## 2015-11-12 DIAGNOSIS — Z8551 Personal history of malignant neoplasm of bladder: Secondary | ICD-10-CM | POA: Diagnosis not present

## 2015-11-12 DIAGNOSIS — N179 Acute kidney failure, unspecified: Secondary | ICD-10-CM | POA: Diagnosis present

## 2015-11-12 DIAGNOSIS — N183 Chronic kidney disease, stage 3 (moderate): Secondary | ICD-10-CM | POA: Diagnosis present

## 2015-11-12 DIAGNOSIS — D62 Acute posthemorrhagic anemia: Secondary | ICD-10-CM | POA: Diagnosis present

## 2015-11-12 DIAGNOSIS — Z888 Allergy status to other drugs, medicaments and biological substances status: Secondary | ICD-10-CM | POA: Diagnosis not present

## 2015-11-12 DIAGNOSIS — Z87891 Personal history of nicotine dependence: Secondary | ICD-10-CM | POA: Diagnosis not present

## 2015-11-12 DIAGNOSIS — R000 Tachycardia, unspecified: Secondary | ICD-10-CM | POA: Diagnosis present

## 2015-11-12 DIAGNOSIS — T83511A Infection and inflammatory reaction due to indwelling urethral catheter, initial encounter: Secondary | ICD-10-CM | POA: Diagnosis present

## 2015-11-12 DIAGNOSIS — E785 Hyperlipidemia, unspecified: Secondary | ICD-10-CM | POA: Diagnosis present

## 2015-11-12 DIAGNOSIS — Z66 Do not resuscitate: Secondary | ICD-10-CM | POA: Diagnosis present

## 2015-11-12 DIAGNOSIS — E860 Dehydration: Secondary | ICD-10-CM | POA: Diagnosis present

## 2015-11-12 DIAGNOSIS — N390 Urinary tract infection, site not specified: Secondary | ICD-10-CM | POA: Diagnosis present

## 2015-11-12 DIAGNOSIS — I9589 Other hypotension: Secondary | ICD-10-CM | POA: Diagnosis present

## 2015-11-12 DIAGNOSIS — I2510 Atherosclerotic heart disease of native coronary artery without angina pectoris: Secondary | ICD-10-CM | POA: Diagnosis present

## 2015-11-12 DIAGNOSIS — Z8249 Family history of ischemic heart disease and other diseases of the circulatory system: Secondary | ICD-10-CM | POA: Diagnosis not present

## 2015-11-12 DIAGNOSIS — K922 Gastrointestinal hemorrhage, unspecified: Secondary | ICD-10-CM | POA: Diagnosis present

## 2015-11-12 DIAGNOSIS — I959 Hypotension, unspecified: Secondary | ICD-10-CM | POA: Diagnosis present

## 2015-11-12 DIAGNOSIS — A419 Sepsis, unspecified organism: Secondary | ICD-10-CM | POA: Diagnosis present

## 2015-11-12 DIAGNOSIS — M1990 Unspecified osteoarthritis, unspecified site: Secondary | ICD-10-CM | POA: Diagnosis present

## 2015-11-12 DIAGNOSIS — E871 Hypo-osmolality and hyponatremia: Secondary | ICD-10-CM | POA: Diagnosis present

## 2015-11-12 DIAGNOSIS — Z79899 Other long term (current) drug therapy: Secondary | ICD-10-CM | POA: Diagnosis not present

## 2015-11-12 DIAGNOSIS — K921 Melena: Secondary | ICD-10-CM | POA: Diagnosis present

## 2015-11-12 DIAGNOSIS — K210 Gastro-esophageal reflux disease with esophagitis: Secondary | ICD-10-CM | POA: Diagnosis present

## 2015-11-12 DIAGNOSIS — Z7982 Long term (current) use of aspirin: Secondary | ICD-10-CM | POA: Diagnosis not present

## 2015-11-12 DIAGNOSIS — K920 Hematemesis: Secondary | ICD-10-CM | POA: Diagnosis present

## 2015-11-12 DIAGNOSIS — E538 Deficiency of other specified B group vitamins: Secondary | ICD-10-CM | POA: Diagnosis present

## 2015-11-12 LAB — CBC
HEMATOCRIT: 19.9 % — AB (ref 40.0–52.0)
Hemoglobin: 6.9 g/dL — ABNORMAL LOW (ref 13.0–18.0)
MCH: 30.5 pg (ref 26.0–34.0)
MCHC: 34.8 g/dL (ref 32.0–36.0)
MCV: 87.6 fL (ref 80.0–100.0)
PLATELETS: 249 10*3/uL (ref 150–440)
RBC: 2.27 MIL/uL — ABNORMAL LOW (ref 4.40–5.90)
RDW: 15.8 % — AB (ref 11.5–14.5)
WBC: 6.7 10*3/uL (ref 3.8–10.6)

## 2015-11-12 LAB — CREATININE, SERUM
Creatinine, Ser: 1.41 mg/dL — ABNORMAL HIGH (ref 0.61–1.24)
GFR calc non Af Amer: 41 mL/min — ABNORMAL LOW (ref >60)
GFR, EST AFRICAN AMERICAN: 48 mL/min — AB (ref >60)

## 2015-11-12 LAB — PROCALCITONIN: PROCALCITONIN: 0.24 ng/mL

## 2015-11-12 LAB — TSH: TSH: 2.54 u[IU]/mL (ref 0.350–4.500)

## 2015-11-12 LAB — APTT: aPTT: 37 seconds — ABNORMAL HIGH (ref 24–36)

## 2015-11-12 LAB — LACTIC ACID, PLASMA
LACTIC ACID, VENOUS: 1.2 mmol/L (ref 0.5–1.9)
LACTIC ACID, VENOUS: 1.5 mmol/L (ref 0.5–1.9)

## 2015-11-12 LAB — HEMOGLOBIN AND HEMATOCRIT, BLOOD
HCT: 23 % — ABNORMAL LOW (ref 40.0–52.0)
Hemoglobin: 7.6 g/dL — ABNORMAL LOW (ref 13.0–18.0)

## 2015-11-12 LAB — PROTIME-INR
INR: 1.25
PROTHROMBIN TIME: 15.8 s — AB (ref 11.4–15.2)

## 2015-11-12 LAB — HEMOGLOBIN A1C: Hgb A1c MFr Bld: 5.7 % (ref 4.0–6.0)

## 2015-11-12 LAB — MRSA PCR SCREENING: MRSA by PCR: POSITIVE — AB

## 2015-11-12 MED ORDER — METRONIDAZOLE 500 MG PO TABS: 500.0000 mg | ORAL_TABLET | Freq: Three times a day (TID) | ORAL | Status: DC

## 2015-11-12 MED ORDER — DEXTROSE-NACL 5-0.9 % IV SOLN
INTRAVENOUS | Status: DC
  Administered 2015-11-12 – 2015-11-14 (×4): via INTRAVENOUS

## 2015-11-12 MED ORDER — GUAIFENESIN-DM 100-10 MG/5ML PO SYRP: 5.0000 mL | ORAL_SOLUTION | ORAL | Status: DC | PRN

## 2015-11-12 MED ORDER — LOPERAMIDE HCL 2 MG PO CAPS: 2.0000 mg | ORAL_CAPSULE | Freq: Two times a day (BID) | ORAL | Status: DC | PRN

## 2015-11-12 MED ORDER — ACETAMINOPHEN 650 MG RE SUPP
650.0000 mg | Freq: Four times a day (QID) | RECTAL | Status: DC | PRN
  Administered 2015-11-12: 650 mg via RECTAL

## 2015-11-12 MED ORDER — ONDANSETRON HCL 4 MG/2ML IJ SOLN
4.0000 mg | Freq: Four times a day (QID) | INTRAMUSCULAR | Status: DC | PRN
Start: 2015-11-12 — End: 2015-11-15

## 2015-11-12 MED ORDER — PIPERACILLIN-TAZOBACTAM 3.375 G IVPB 30 MIN: 3.3750 g | Freq: Once | INTRAVENOUS | Status: DC

## 2015-11-12 MED ORDER — ACETAMINOPHEN 325 MG PO TABS
650.0000 mg | ORAL_TABLET | Freq: Four times a day (QID) | ORAL | Status: DC | PRN
  Administered 2015-11-13: 650 mg via ORAL
  Filled 2015-11-12 (×2): qty 2

## 2015-11-12 MED ORDER — VANCOMYCIN HCL IN DEXTROSE 1-5 GM/200ML-% IV SOLN
1000.0000 mg | Freq: Once | INTRAVENOUS | Status: AC
  Administered 2015-11-12: 1000 mg via INTRAVENOUS
  Filled 2015-11-12: qty 200

## 2015-11-12 MED ORDER — VITAMIN D 1000 UNITS PO TABS
1000.0000 [IU] | ORAL_TABLET | Freq: Every day | ORAL | Status: DC
  Administered 2015-11-13 – 2015-11-15 (×3): 1000 [IU] via ORAL
  Filled 2015-11-12 (×4): qty 1

## 2015-11-12 MED ORDER — MIRTAZAPINE 15 MG PO TABS: 30.0000 mg | ORAL_TABLET | Freq: Every day | ORAL | Status: DC

## 2015-11-12 MED ORDER — ONDANSETRON HCL 4 MG PO TABS: 4.0000 mg | ORAL_TABLET | Freq: Four times a day (QID) | ORAL | Status: DC | PRN

## 2015-11-12 MED ORDER — FAMOTIDINE IN NACL 20-0.9 MG/50ML-% IV SOLN
20.0000 mg | Freq: Two times a day (BID) | INTRAVENOUS | Status: DC
  Filled 2015-11-12 (×2): qty 50

## 2015-11-12 MED ORDER — POLYVINYL ALCOHOL 1.4 % OP SOLN
1.0000 [drp] | OPHTHALMIC | Status: DC | PRN
  Filled 2015-11-12: qty 15

## 2015-11-12 MED ORDER — PANTOPRAZOLE SODIUM 40 MG IV SOLR
40.0000 mg | Freq: Two times a day (BID) | INTRAVENOUS | Status: DC
  Administered 2015-11-12 – 2015-11-13 (×4): 40 mg via INTRAVENOUS
  Filled 2015-11-12 (×4): qty 40

## 2015-11-12 MED ORDER — SODIUM CHLORIDE 0.9 % IV BOLUS (SEPSIS)
1000.0000 mL | Freq: Once | INTRAVENOUS | Status: AC
  Administered 2015-11-12: 1000 mL via INTRAVENOUS

## 2015-11-12 MED ORDER — FUROSEMIDE 20 MG PO TABS
20.0000 mg | ORAL_TABLET | Freq: Every day | ORAL | Status: DC
Start: 2015-11-12 — End: 2015-11-12
  Filled 2015-11-12: qty 1

## 2015-11-12 MED ORDER — DULOXETINE HCL 60 MG PO CPEP
60.0000 mg | ORAL_CAPSULE | Freq: Every day | ORAL | Status: DC
  Administered 2015-11-13 – 2015-11-15 (×3): 60 mg via ORAL
  Filled 2015-11-12: qty 2
  Filled 2015-11-12 (×2): qty 1
  Filled 2015-11-12: qty 2

## 2015-11-12 MED ORDER — SIMVASTATIN 40 MG PO TABS
40.0000 mg | ORAL_TABLET | Freq: Every day | ORAL | Status: DC
  Administered 2015-11-13 – 2015-11-14 (×2): 40 mg via ORAL
  Filled 2015-11-12 (×3): qty 1

## 2015-11-12 MED ORDER — DIPHENHYDRAMINE HCL 25 MG PO CAPS: 50.0000 mg | ORAL_CAPSULE | Freq: Every day | ORAL | Status: DC

## 2015-11-12 MED ORDER — CEFTRIAXONE SODIUM 1 G IJ SOLR
1.0000 g | Freq: Every day | INTRAMUSCULAR | Status: DC
  Administered 2015-11-12: 1 g via INTRAVENOUS
  Filled 2015-11-12: qty 10

## 2015-11-12 MED ORDER — VANCOMYCIN HCL IN DEXTROSE 1-5 GM/200ML-% IV SOLN: 1000.0000 mg | Freq: Once | INTRAVENOUS | Status: DC

## 2015-11-12 MED ORDER — DOCUSATE SODIUM 100 MG PO CAPS
100.0000 mg | ORAL_CAPSULE | Freq: Two times a day (BID) | ORAL | Status: DC
  Filled 2015-11-12: qty 1

## 2015-11-12 MED ORDER — PIPERACILLIN-TAZOBACTAM 3.375 G IVPB
3.3750 g | Freq: Three times a day (TID) | INTRAVENOUS | Status: DC
  Administered 2015-11-12 – 2015-11-14 (×7): 3.375 g via INTRAVENOUS
  Filled 2015-11-12 (×10): qty 50

## 2015-11-12 MED ORDER — NITROGLYCERIN 0.4 MG SL SUBL: 0.4000 mg | SUBLINGUAL_TABLET | SUBLINGUAL | Status: DC | PRN

## 2015-11-12 MED ORDER — CYANOCOBALAMIN 1000 MCG/ML IJ SOLN: 1000.0000 ug | INTRAMUSCULAR | Status: DC

## 2015-11-12 NOTE — H&P (Signed)
Shawn Lyons is an 82 y.o. male.   Chief Complaint: Hypotension HPI: The patient with past medical history of coronary artery disease and bladder cancer status post ileal ureterostomy placement presents emergency department from the nursing home due to concerns for hypotension. Systolic blood pressure was reportedly under 100 but has consistently been above 110 here at the hospital. Also noted is melanotic stools and coffee-ground emesis. The patient's daughter states that he has had sticky dark vomit per nursing home staff. The patient has dementia but has also complained of some abdominal pain over the last several weeks. The nursing home staff also reported diarrhea but he has had no loose stools since arrival. In the emergency department he was found to be mildly tachycardic but in no acute distress. Laboratory evaluation was significant for some mild acute kidney injury. After intravenous PPI and fluid resuscitation emergency department staff called the hospitalist service for further management.  Past Medical History:  Diagnosis Date  . Arthritis   . Cancer [facility])    h/o bladder cancer  . Coronary artery disease   . Depression   . Vitamin B12 deficiency     Past Surgical History:  Procedure Laterality Date  . EYE SURGERY    . HERNIA REPAIR    . KNEE ARTHROSCOPY Left 2006  . MOHS SURGERY  2012  . REVISION UROSTOMY CUTANEOUS  2000    Family History  Problem Relation Age of Onset  . Heart attack Father    Social History:  reports that he has quit smoking. He has never used smokeless tobacco. He reports that he does not drink alcohol or use drugs.  Allergies:  Allergies  Allergen Reactions  . Augmentin [Amoxicillin-Pot Clavulanate] Nausea Only  . Prilosec [Omeprazole] Diarrhea    Medications Prior to Admission  Medication Sig Dispense Refill  . acetaminophen (TYLENOL) 500 MG tablet Take 1,000 mg by mouth every 4 (four) hours as needed for moderate pain (max of 4073m per 24   hours).    .Marland Kitchenacetaminophen (TYLENOL) 650 MG CR tablet Take 650 mg by mouth every 8 (eight) hours.    .Marland Kitchenalbuterol (PROVENTIL HFA;VENTOLIN HFA) 108 (90 BASE) MCG/ACT inhaler Inhale 1 puff into the lungs every 4 (four) hours as needed for wheezing.    .Marland Kitchenalbuterol (PROVENTIL) (2.5 MG/3ML) 0.083% nebulizer solution Take 2.5 mg by nebulization 3 (three) times daily as needed for wheezing or shortness of breath.    .Marland Kitchenaspirin EC 81 MG tablet Take 81 mg by mouth at bedtime.     . cholecalciferol (VITAMIN D) 1000 UNITS tablet Take 1,000 Units by mouth daily.    . Cyanocobalamin 1000 MCG/ML KIT Inject 1 mL as directed every 30 (thirty) days. Around the 28th day of the month    . DULoxetine (CYMBALTA) 60 MG capsule Take 60 mg by mouth daily.    . furosemide (LASIX) 20 MG tablet Take 20 mg by mouth daily as needed for edema.     .Marland KitchenguaiFENesin-dextromethorphan (ROBITUSSIN DM) 100-10 MG/5ML syrup Take 5 mLs by mouth every 4 (four) hours as needed for cough.    . hydroxypropyl methylcellulose / hypromellose (ISOPTO TEARS / GONIOVISC) 2.5 % ophthalmic solution Place 1 drop into both eyes every 2 (two) hours as needed for dry eyes.    .Marland Kitchenlansoprazole (PREVACID) 30 MG capsule Take 30 mg by mouth daily at 12 noon.    . mirtazapine (REMERON) 30 MG tablet Take 30 mg by mouth at bedtime.     .Marland Kitchen  nitroGLYCERIN (NITROSTAT) 0.4 MG SL tablet Place 0.4 mg under the tongue every 5 (five) minutes as needed for chest pain.    . polyethylene glycol (MIRALAX / GLYCOLAX) packet Take 17 g by mouth daily.     . simvastatin (ZOCOR) 40 MG tablet Take 40 mg by mouth at bedtime.      Results for orders placed or performed during the hospital encounter of 11/11/15 (from the past 48 hour(s))  Type and screen [place]     Status: None   Collection Time: 11/11/15 12:12 AM  Result Value Ref Range   ABO/RH(D) O POS    Antibody Screen NEG    Sample Expiration 11/14/2015   CBC     Status: Abnormal   Collection Time:  11/11/15 10:11 PM  Result Value Ref Range   WBC 9.7 3.8 - 10.6 K/uL   RBC 3.03 (L) 4.40 - 5.90 MIL/uL   Hemoglobin 9.1 (L) 13.0 - 18.0 g/dL   HCT 26.6 (L) 40.0 - 52.0 %   MCV 87.8 80.0 - 100.0 fL   MCH 29.9 26.0 - 34.0 pg   MCHC 34.0 32.0 - 36.0 g/dL   RDW 15.3 (H) 11.5 - 14.5 %   Platelets 320 150 - 440 K/uL  Comprehensive metabolic panel     Status: Abnormal   Collection Time: 11/11/15 10:11 PM  Result Value Ref Range   Sodium 134 (L) 135 - 145 mmol/L   Potassium 4.6 3.5 - 5.1 mmol/L   Chloride 107 101 - 111 mmol/L   CO2 17 (L) 22 - 32 mmol/L   Glucose, Bld 132 (H) 65 - 99 mg/dL   BUN 81 (H) 6 - 20 mg/dL   Creatinine, Ser 1.51 (H) 0.61 - 1.24 mg/dL   Calcium 8.9 8.9 - 10.3 mg/dL   Total Protein 6.3 (L) 6.5 - 8.1 g/dL   Albumin 3.3 (L) 3.5 - 5.0 g/dL   AST 23 15 - 41 U/L   ALT 14 (L) 17 - 63 U/L   Alkaline Phosphatase 68 38 - 126 U/L   Total Bilirubin 0.3 0.3 - 1.2 mg/dL   GFR calc non Af Amer 38 (L) >60 mL/min   GFR calc Af Amer 44 (L) >60 mL/min    Comment: (NOTE) The eGFR has been calculated using the CKD EPI equation. This calculation has not been validated in all clinical situations. eGFR's persistently <60 mL/min signify possible Chronic Kidney Disease.    Anion gap 10 5 - 15  Lipase, blood     Status: None   Collection Time: 11/11/15 10:11 PM  Result Value Ref Range   Lipase 18 11 - 51 U/L  Urinalysis complete, with microscopic ([facility] only)     Status: Abnormal   Collection Time: 11/11/15 10:23 PM  Result Value Ref Range   Color, Urine YELLOW (A) YELLOW   APPearance HAZY (A) CLEAR   Glucose, UA NEGATIVE NEGATIVE mg/dL   Bilirubin Urine NEGATIVE NEGATIVE   Ketones, ur NEGATIVE NEGATIVE mg/dL   Specific Gravity, Urine 1.013 1.005 - 1.030   Hgb urine dipstick NEGATIVE NEGATIVE   pH 6.0 5.0 - 8.0   Protein, ur NEGATIVE NEGATIVE mg/dL   Nitrite POSITIVE (A) NEGATIVE   Leukocytes, UA 3+ (A) NEGATIVE   RBC / HPF 0-5 0 - 5 RBC/hpf   WBC, UA 6-30 0 - 5 WBC/hpf    Bacteria, UA FEW (A) NONE SEEN   Squamous Epithelial / LPF NONE SEEN NONE SEEN   Mucous PRESENT  Ct Abdomen Pelvis W Contrast  Result Date: 11/12/2015 CLINICAL DATA:  Emesis and abnormally colored stools. EXAM: CT ABDOMEN AND PELVIS WITH CONTRAST TECHNIQUE: Multidetector CT imaging of the abdomen and pelvis was performed using the standard protocol following bolus administration of intravenous contrast. CONTRAST:  51m ISOVUE-300 IOPAMIDOL (ISOVUE-300) INJECTION 61% COMPARISON:  CT abdomen 09/06/2011 FINDINGS: Lower chest: There is a moderate hiatal hernia, increased in size to the prior examination. There is mild luminal enhancement and possible wall edema. There is lingular atelectasis. No pulmonary nodules. No visible pleural or pericardial effusion. There is aortic and coronary artery calcification. Hepatobiliary: Normal hepatic size and contours without focal liver lesion. No perihepatic ascites. No intra- or extrahepatic biliary dilatation. Normal gallbladder. Pancreas: There are multiple small pancreatic calcifications. The pancreas is otherwise normal. Spleen: Normal. Adrenals/Urinary Tract: The adrenal glands are normal. There is bilateral lobulated, mildly atrophic appearance of both kidneys. There are multiple large bilateral renal cysts, measuring up to 4.4 centimeters. There is no solid renal mass visualized. The patient is status post cystectomy with formation of a right lower quadrant conduit. There is moderate bilateral hydronephrosis and hydroureter. Delayed phase imaging does not show adequate opacification of the ureters. Stomach/Bowel: There is a left inguinal hernia containing a loop of nondilated sigmoid colon. There is no evidence of small-bowel obstruction. No evidence of acute inflammation. No fluid collection within the abdomen. Vascular/Lymphatic: There is atherosclerosis of the aorta and branch vessels. No aortic aneurysm. No abdominal or pelvic adenopathy. Reproductive: Prostate  is surgically absent. No pelvic fluid collection. Musculoskeletal: Multilevel lumbar facet arthrosis and thoracolumbar osteophytosis. No focal lytic or blastic osseous lesions. The visualized extraperitoneal and extrathoracic soft tissues are normal. Other: No contributory non-categorized findings. IMPRESSION: 1. Left inguinal hernia, containing a portion of nondilated sigmoid colon, new from the prior study. 2. Moderate-sized hiatal hernia with mild luminal enhancement and possible wall thickening. Correlate for signs of esophagitis. 3. Aortic and coronary artery atherosclerotic calcification. 4. Status post cystectomy with right lower quadrant conduit with bilateral expected hydronephrosis. Lack of opacification of the collecting systems on the delayed phase sequences may be due to timing, versus diminished renal function. Electronically Signed   By: KUlyses JarredM.D.   On: 11/12/2015 00:34    Review of Systems  Unable to perform ROS: Dementia    Blood pressure (!) 113/44, pulse 95, temperature (!) 101.4 F (38.6 C), temperature source Axillary, resp. rate 19, weight 62.1 kg (136 lb 12.8 oz), SpO2 100 %. Physical Exam  Constitutional: He appears well-developed and well-nourished. No distress.  HENT:  Head: Normocephalic and atraumatic.  Mouth/Throat: Oropharynx is clear and moist.  Eyes: Conjunctivae and EOM are normal. Pupils are equal, round, and reactive to light. No scleral icterus.  Neck: Normal range of motion. Neck supple. No JVD present. No tracheal deviation present. No thyromegaly present.  Cardiovascular: Normal rate, regular rhythm and normal heart sounds.  Exam reveals no gallop and no friction rub.   No murmur heard. Respiratory: Effort normal and breath sounds normal. No respiratory distress.  GI: Soft. Bowel sounds are normal. He exhibits no distension. There is no tenderness.  Ileo-ureterostomy in place  Genitourinary:  Genitourinary Comments: Deferred  Musculoskeletal:  Normal range of motion. He exhibits no edema.  Lymphadenopathy:    He has no cervical adenopathy.  Neurological: He is disoriented. No cranial nerve deficit.  Sleeping soundly; is oriented and groggy when aroused  Skin: Skin is warm and dry. No rash noted. No erythema.  Psychiatric:  Difficult to assess mental status due to dementia     Assessment/Plan This is a 82 year old male admitted for GI bleed and acute kidney injury. 1. GI bleed: Likely slow upper GI as emesis and stool is dark and tarry. The patient is hemodynamically stable. Continue IV PPI therapy. Nothing by mouth. Gastroenterology is consulted. 2. Acute kidney injury: Secondary to bleeding and/or dehydration. Hydrate with intravenous fluid. Avoid nephrotoxic agents. 3. UTI: Initially I gave the patient ceftriaxone for urinary tract infection before I realized he had an ileo-ureterostomy; thus he should always have bacteria in his urine in which case continuation of antibiotics would not be necessary. He has been on Cipro as an outpatient at some point prior to admission (likely prophylactically). However, since admission to the floor the patient has developed a fever and now meets criteria for sepsis. 4. Sepsis: The patient meets criteria via fever and tachycardia. I will cover enteric organisms. Follow blood cultures and urine cultures for sensitivities (particularly enterococcus species). 5. CAD: Stable; continue aspirin. 6. Hyponatremia: Secondary to diverting ureterostomy. Continue normal saline 7. Hyperlipidemia: Continue statin therapy 8. GI prophylaxis: IV Pepcid The patient is a DO NOT RESUSCITATE. Time spent on admission orders and patient care approximately 45 minutes  Harrie Foreman, MD 11/12/2015, 6:29 AM

## 2015-11-12 NOTE — Progress Notes (Signed)
Pharmacy Antibiotic Note  Shawn Lyons is a 82 y.o. male admitted on 11/11/2015 with IAI.  Pharmacy has been consulted for Zosyn dosing.  Plan: Zosyn 3.375g IV q8h (4 hour infusion). CrCl 22 mL/min, pharmacy will continue to follow and adjust if needed for renal function.   Height: 5' 0.98" (154.9 cm) Weight: 136 lb 12.8 oz (62.1 kg) IBW/kg (Calculated) : 52.26  Temp (24hrs), Avg:100 F (37.8 C), Min:98.5 F (36.9 C), Max:101.4 F (38.6 C)   Recent Labs Lab 11/11/15 2211  WBC 9.7  CREATININE 1.51*    Estimated Creatinine Clearance: 22.1 mL/min (by C-G formula based on SCr of 1.51 mg/dL).    Allergies  Allergen Reactions  . Augmentin [Amoxicillin-Pot Clavulanate] Nausea Only  . Prilosec [Omeprazole] Diarrhea   Thank you for allowing pharmacy to be a part of this patient's care.  Laural Benes, Pharm.D., BCPS Clinical Pharmacist 11/12/2015 6:57 AM

## 2015-11-12 NOTE — Progress Notes (Signed)
Pt's Hgb noted to be 6.9. eLink called and Dr. Juanell Fairly stated to draw another H&H and call back if Hgb is below 7.

## 2015-11-12 NOTE — Clinical Social Work Note (Signed)
Clinical Social Work Assessment  Patient Details  Name: Shawn Lyons MRN: [id number] Date of Birth: [birth year]  Date of referral:  11/12/15               Reason for consult:  Other (Comment Required) (From [facility] )                Permission sought to share information with:  Chartered certified accountant granted to share information::  Yes, Verbal Permission Granted  Name::      [place]::   [place]   Relationship::     Contact Information:     Housing/Transportation Living arrangements for the past 2 months:  [place] of Information:  Adult [place] Patient Interpreter Needed:  None Criminal Activity/Legal Involvement Pertinent to Current Situation/Hospitalization:  No - Comment as needed Significant Relationships:  Adult Children Lives with:  Facility Resident Do you feel safe going back to the place where you live?    Need for family participation in patient care:  Yes (Comment)  Care giving concerns:  Patient is a long term care resident at [facility].    Social Worker assessment / plan:  Holiday representative ([place]) reviewed chart and noted that patient is from [place]. Per [name] admissions coordinator at [facility] patient is a long term care resident on their SNF side and is not followed by hospice. Per [name] patient can return to [facility] when stable for D/C. CSW attempted to meet with patient however he was asleep and per RN patient is not alert and oriented. CSW contacted patient's daughter [name] and Roda Shutters husband Eddie Dibbles answered the phone. Per Tillman Sers just got home from the hospital and laid down to rest. Per Tillman Sers is patient's HPOA and is agreeable for patient to return to [facility]. Eddie Dibbles reported that patient has another daughter however she is not "reliable."   FL2 complete and faxed out. CSW will continue to follow and assist as needed.     Employment status:  Retired Forensic scientist:  Information systems manager, Medicaid In [place] PT Recommendations:  Not assessed at this time [place] / Referral to community resources:  [place]  Patient/Family's Response to care:  Patient's son in law Eddie Dibbles is agreeable for patient to return to [facility].   Patient/Family's Understanding of and Emotional Response to Diagnosis, Current Treatment, and Prognosis: Eddie Dibbles was pleasant and thanked CSW for calling.   Emotional Assessment Appearance:  Appears stated age Attitude/Demeanor/Rapport:  Unable to Assess Affect (typically observed):  Unable to Assess Orientation:  Oriented to Self, Fluctuating Orientation (Suspected and/or reported [place]) Alcohol / Substance use:  Not Applicable Psych involvement (Current and /or in the community):  No (Comment)  Discharge Needs  Concerns to be addressed:  Discharge Planning Concerns Readmission within the last 30 days:  No Current discharge risk:  Chronically ill, Cognitively Impaired, Dependent with Mobility Barriers to Discharge:  Continued Medical Work up   [employer], Veronia Beets, LCSW 11/12/2015, 10:32 AM

## 2015-11-12 NOTE — NC FL2 (Signed)
[place] LEVEL OF CARE SCREENING TOOL     IDENTIFICATION  Patient Name: Shawn Lyons Birthdate: 1919/06/25 Sex: male Admission Date (Current Location): 11/11/2015  [facility] and [place] Number:  Selena Lesser  ([id number]) Facility and Address:  [facility], [address] [place], [place] [address]      Provider Number: [id number]  Attending Physician Name and Address:  Lytle Butte, MD  Relative Name and Phone Number:       Current Level of Care: Hospital Recommended Level of Care: [place] Prior Approval Number:    Date Approved/Denied:   PASRR Number:  ([id number])  Discharge Plan: SNF    Current Diagnoses: Patient Active Problem List   Diagnosis Date Noted  . GI bleed 11/12/2015  . Sepsis ([place]) 12/08/2014  . Hyponatremia 12/08/2014  . HYPERLIPIDEMIA 12/04/2007  . CHRONIC OBSTRUCTIVE ASTHMA UNSPECIFIED 12/04/2007  . SLEEP APNEA 12/04/2007  . CORONARY HEART DISEASE 12/01/2007    Orientation RESPIRATION BLADDER Height & Weight     Self  Normal Incontinent, Urostomy Weight: 136 lb 12.8 oz (62.1 kg) Height:  5' 0.98" (154.9 cm)  BEHAVIORAL SYMPTOMS/MOOD NEUROLOGICAL BOWEL NUTRITION STATUS   (none )  (none ) Continent Diet (NPO )  AMBULATORY STATUS COMMUNICATION OF NEEDS Skin   Extensive Assist Verbally Normal                       Personal Care Assistance Level of Assistance  Bathing, Feeding, Dressing Bathing Assistance: Limited assistance Feeding assistance: Independent Dressing Assistance: Limited assistance     Functional Limitations Info  Sight, Hearing, Speech Sight Info: Adequate Hearing Info: Adequate Speech Info: Adequate    SPECIAL CARE FACTORS FREQUENCY                       Contractures      Additional Factors Info  Code Status, Allergies, Isolation Precautions Code Status Info:  (DNR ) Allergies Info:  (Augmentin Amoxicillin-pot Clavulanate, Prilosec Omeprazole)     Isolation Precautions Info:  (MRSA Nasal Swab, Enteric Precautions. )     Current Medications (11/12/2015):  This is the current hospital active medication list Current Facility-Administered Medications  Medication Dose Route Frequency Provider Last Rate Last Dose  . acetaminophen (TYLENOL) tablet 650 mg  650 mg Oral Q6H PRN Harrie Foreman, MD       Or  . acetaminophen (TYLENOL) suppository 650 mg  650 mg Rectal Q6H PRN Harrie Foreman, MD      . cholecalciferol (VITAMIN D) tablet 1,000 Units  1,000 Units Oral Daily Harrie Foreman, MD      . Derrill Memo ON 12/12/2015] cyanocobalamin ((VITAMIN B-12)) injection 1,000 mcg  1,000 mcg Intramuscular Q30 days Harrie Foreman, MD      . dextrose 5 %-0.9 % sodium chloride infusion   Intravenous Continuous Harrie Foreman, MD 100 mL/hr at 11/12/15 0654    . diphenhydrAMINE (BENADRYL) capsule 50 mg  50 mg Oral QHS Harrie Foreman, MD      . docusate sodium (COLACE) capsule 100 mg  100 mg Oral BID Harrie Foreman, MD      . DULoxetine (CYMBALTA) DR capsule 60 mg  60 mg Oral Daily Harrie Foreman, MD      . guaiFENesin-dextromethorphan [facility]) 100-10 MG/5ML syrup 5 mL  5 mL Oral Q4H PRN Harrie Foreman, MD      . loperamide (IMODIUM) capsule 2 mg  2  mg Oral Q12H PRN Harrie Foreman, MD      . mirtazapine (REMERON) tablet 30 mg  30 mg Oral QHS Harrie Foreman, MD      . nitroGLYCERIN (NITROSTAT) SL tablet 0.4 mg  0.4 mg Sublingual Q5 min PRN Harrie Foreman, MD      . ondansetron [facility]) tablet 4 mg  4 mg Oral Q6H PRN Harrie Foreman, MD       Or  . ondansetron [facility]) injection 4 mg  4 mg Intravenous Q6H PRN Harrie Foreman, MD      . pantoprazole (PROTONIX) injection 40 mg  40 mg Intravenous Q12H Lytle Butte, MD      . piperacillin-tazobactam (ZOSYN) IVPB 3.375 g  3.375 g Intravenous Q8H Lytle Butte, MD   3.375 g at 11/12/15 0825  . polyvinyl alcohol (LIQUIFILM TEARS) 1.4 % ophthalmic solution 1 drop  1 drop Both  Eyes PRN Harrie Foreman, MD      . polyvinyl alcohol (LIQUIFILM TEARS) 1.4 % ophthalmic solution 1 drop  1 drop Both Eyes Q2H PRN Harrie Foreman, MD      . simvastatin (ZOCOR) tablet 40 mg  40 mg Oral QHS Harrie Foreman, MD      . vancomycin (VANCOCIN) IVPB 1000 mg/200 mL premix  1,000 mg Intravenous Once Harrie Foreman, MD   1,000 mg at 11/12/15 [id number]     Discharge Medications: Please see discharge summary for a list of discharge medications.  Relevant Imaging Results:  Relevant Lab Results:   Additional Information  (SSN: [id number])  Greely Atiyeh, Veronia Beets, LCSW

## 2015-11-12 NOTE — Progress Notes (Signed)
Lab called with critical result. Positive MRSA PCR. Contact precautions initiated, pt is already on eneteric precautions pending C. Diff sample. Will notify MD at rounds.

## 2015-11-12 NOTE — Progress Notes (Signed)
Received report from [place]. Pt came to the unit with a blood pressure of 116/44 with approximately 500 mL infused.

## 2015-11-12 NOTE — ED Provider Notes (Signed)
[facility] Emergency Department Provider Note   ____________________________________________   First MD Initiated Contact with Patient 11/11/15 2311     (approximate)  I have reviewed the triage vital signs and the nursing notes.   HISTORY  Chief Complaint Diarrhea and Emesis  History is limited by patient dementia and is obtained from daughter  HPI Shawn Lyons is a 82 y.o. male who comes into the hospital today with vomiting and diarrhea. The patient is a resident at [facility] and his family reports that they were out of town this week. They report that the patient had so much vomiting and diarrhea that he had a lower blood pressure. They gave the patient some fluid but his pressure did not improve so EMS was called to bring in the patient. The vomiting and diarrhea started sometime over the weekend but the family is unsure when. They report that the patient's emesis was black and sticky but they are unable to tell me how many times her had vomiting and diarrhea. The patient is unable to verbalize if he is in pain. He is here for evaluation.   Past Medical History:  Diagnosis Date  . Arthritis   . Cancer ([place])   . Coronary artery disease   . Depression   . Vitamin B12 deficiency     Patient Active Problem List   Diagnosis Date Noted  . GI bleed 11/12/2015  . Sepsis ([place]) 12/08/2014  . Hyponatremia 12/08/2014  . HYPERLIPIDEMIA 12/04/2007  . CHRONIC OBSTRUCTIVE ASTHMA UNSPECIFIED 12/04/2007  . SLEEP APNEA 12/04/2007  . CORONARY HEART DISEASE 12/01/2007    Past Surgical History:  Procedure Laterality Date  . EYE SURGERY    . HERNIA REPAIR    . KNEE ARTHROSCOPY Left 2006  . MOHS SURGERY  2012  . REVISION UROSTOMY CUTANEOUS  2000    Prior to Admission medications   Medication Sig Start Date End Date Taking? Authorizing Provider  acetaminophen (TYLENOL) 500 MG tablet Take 1,000 mg by mouth every 4 (four) hours as needed for moderate  pain (max of 4062m per 24  hours).    Historical Provider, MD  albuterol (PROVENTIL HFA;VENTOLIN HFA) 108 (90 BASE) MCG/ACT inhaler Inhale 1 puff into the lungs every 4 (four) hours as needed for wheezing.    Historical Provider, MD  aspirin EC 81 MG tablet Take 81 mg by mouth at bedtime.     Historical Provider, MD  cholecalciferol (VITAMIN D) 1000 UNITS tablet Take 1,000 Units by mouth daily.    Historical Provider, MD  ciprofloxacin (CIPRO) 500 MG tablet Take 1 tablet (500 mg total) by mouth 2 (two) times daily. 12/11/14   QDemetrios Loll MD  Cyanocobalamin 1000 MCG/ML KIT Inject 1 mL as directed every 30 (thirty) days. Around the 28th day of the month    Historical Provider, MD  diclofenac sodium (VOLTAREN) 1 % GEL Apply 4 g topically 4 (four) times daily. Apply to neck    Historical Provider, MD  DiphenhydrAMINE HCl, Sleep, 50 MG CAPS Take 1 capsule by mouth at bedtime.    Historical Provider, MD  DULoxetine (CYMBALTA) 60 MG capsule Take 60 mg by mouth daily.    Historical Provider, MD  furosemide (LASIX) 20 MG tablet Take 20 mg by mouth daily.     Historical Provider, MD  guaiFENesin-dextromethorphan (ROBITUSSIN DM) 100-10 MG/5ML syrup Take 5 mLs by mouth every 4 (four) hours as needed for cough.    Historical Provider, MD  lansoprazole (PREVACID) 15  MG capsule Take 15 mg by mouth daily at 12 noon.    Historical Provider, MD  loperamide (IMODIUM) 2 MG capsule Take 2 mg by mouth every 12 (twelve) hours as needed for diarrhea or loose stools.    Historical Provider, MD  mirtazapine (REMERON) 30 MG tablet Take 30 mg by mouth at bedtime.  08/14/14   Historical Provider, MD  polyethylene glycol (MIRALAX / GLYCOLAX) packet Take 17 g by mouth daily as needed.     Historical Provider, MD  simvastatin (ZOCOR) 40 MG tablet Take 40 mg by mouth at bedtime.    Historical Provider, MD  Soft Lens Products (SENSITIVE EYES SALINE) SOLN Place 1 drop into both eyes as needed (itchy or dry eyes).     Historical  Provider, MD    Allergies Augmentin [amoxicillin-pot clavulanate] and Prilosec [omeprazole]  Family History  Problem Relation Age of Onset  . Heart attack Father     Social History Social History  Substance Use Topics  . Smoking status: Former Research scientist (life sciences)  . Smokeless tobacco: Never Used  . Alcohol use No    Review of Systems Constitutional: fever/chills Gastrointestinal: Vomiting and diarrhea Neurological: dementia Unable to obtain full review of systems as patient with dementia and family not at nursing home  ____________________________________________   PHYSICAL EXAM:  VITAL SIGNS: ED Triage Vitals [11/11/15 2204]  Enc Vitals Group     BP (!) 83/53     Pulse Rate 98     Resp 15     Temp 98.5 F (36.9 C)     Temp Source Oral     SpO2 100 %     Weight      Height      Head Circumference      Peak Flow      Pain Score      Pain Loc      Pain Edu?      Excl. in [place]?     Constitutional: Sleeping but arousable. Well appearing and in no acute distress. Eyes: Conjunctivae are normal. PERRL. EOMI. Head: Atraumatic. Nose: No congestion/rhinnorhea. Mouth/Throat: dry mucus membranes with black in mouth Cardiovascular: Tachycardia, regular rhythm. Grossly normal heart sounds.  Good peripheral circulation. Respiratory: Normal respiratory effort.  No retractions. Lungs CTAB. Gastrointestinal: Soft, No distention. Positive bowel sounds Rectal: black stool, heme positive, control positive Musculoskeletal: No lower extremity tenderness nor edema.   Neurologic:  Normal speech and language. No gross focal neurologic deficits are appreciated.  Skin:  Skin is warm, dry and intact. Psychiatric: Mood and affect are normal.   ____________________________________________   LABS (all labs ordered are listed, but only abnormal results are displayed)  Labs Reviewed  CBC - Abnormal; Notable for the following:       Result Value   RBC 3.03 (*)    Hemoglobin 9.1 (*)    HCT  26.6 (*)    RDW 15.3 (*)    All other components within normal limits  COMPREHENSIVE METABOLIC PANEL - Abnormal; Notable for the following:    Sodium 134 (*)    CO2 17 (*)    Glucose, Bld 132 (*)    BUN 81 (*)    Creatinine, Ser 1.51 (*)    Total Protein 6.3 (*)    Albumin 3.3 (*)    ALT 14 (*)    GFR calc non Af Amer 38 (*)    GFR calc Af Amer 44 (*)    All other components within normal limits  URINALYSIS COMPLETEWITH  MICROSCOPIC ([facility] ONLY) - Abnormal; Notable for the following:    Color, Urine YELLOW (*)    APPearance HAZY (*)    Nitrite POSITIVE (*)    Leukocytes, UA 3+ (*)    Bacteria, UA FEW (*)    All other components within normal limits  GASTROINTESTINAL PANEL BY PCR, STOOL (REPLACES STOOL CULTURE)  C DIFFICILE QUICK SCREEN W PCR REFLEX  LIPASE, BLOOD  TYPE AND SCREEN   ____________________________________________  EKG  ED ECG REPORT I, Loney Hering, the attending physician, personally viewed and interpreted this ECG.   Date: 11/12/2015  EKG Time: 146  Rate: 100  Rhythm: normal sinus rhythm  Axis: normal  Intervals:none  ST&T Change: none  ____________________________________________  RADIOLOGY  Ct abd and pelvis ____________________________________________   PROCEDURES  Procedure(s) performed: None  Procedures  Critical Care performed: No  ____________________________________________   INITIAL IMPRESSION / ASSESSMENT AND PLAN / ED COURSE  Pertinent labs & imaging results that were available during my care of the patient were reviewed by me and considered in my medical decision making (see chart for details).  This is a 82 yo male who comes into the hospital with vomiting and diarrhea. It appears that the emesis is like black coffee grounds and the stool is melena. I will give the patient a dose of pepcid 67m IV and perform a CT scan as the patient is able to convey if he has abd pain. I will give the patient a dose of NS 5041m I  will reassess the patient once I receive the results of the CT scan.    Clinical Course  Value Comment By Time  CT Abdomen Pelvis W Contrast 1. Left inguinal hernia, containing a portion of nondilated sigmoid colon, new from the prior study. 2. Moderate-sized hiatal hernia with mild luminal enhancement and possible wall thickening. Correlate for signs of esophagitis. 3. Aortic and coronary artery atherosclerotic calcification. 4. Status post cystectomy with right lower quadrant conduit with bilateral expected hydronephrosis. Lack of opacification of the collecting systems on the delayed phase sequences may be due to timing, versus diminished renal function.   AlLoney HeringMD 08/29 [phone] The patient will be admitted to the hospital for further evaluation and treatment of his GI bleed. He has no further complaints or concerns at this time.  ____________________________________________   FINAL CLINICAL IMPRESSION(S) / ED DIAGNOSES  Final diagnoses:  Melena  Gastrointestinal hemorrhage with melena  Other specified hypotension      NEW MEDICATIONS STARTED DURING THIS VISIT:  New Prescriptions   No medications on file     Note:  This document was prepared using Dragon voice recognition software and may include unintentional dictation errors.    AlLoney HeringMD 11/12/15 [phone]

## 2015-11-12 NOTE — Progress Notes (Signed)
[place] [facility] [place] NAME: Eulas Plasencia    MRN#:  [id number]  DATE OF BIRTH:  05/11/1919  SUBJECTIVE:  Hospital Day: 0 days Dina Murga is a 82 y.o. male presenting with Diarrhea and Emesis .   Overnight events: febrile Interval Events: febrile, minimally responsive, daughter at bedside  REVIEW OF SYSTEMS:  Unable to obtain given mental status  DRUG ALLERGIES:   Allergies  Allergen Reactions  . Augmentin [Amoxicillin-Pot Clavulanate] Nausea Only  . Prilosec [Omeprazole] Diarrhea    VITALS:  Blood pressure (!) 78/32, pulse 79, temperature 99.4 F (37.4 C), temperature source Axillary, resp. rate 20, height 5' 0.98" (1.549 m), weight 62.1 kg (136 lb 12.8 oz), SpO2 100 %.  PHYSICAL EXAMINATION:   VITAL SIGNS: Vitals:   11/12/15 0830 11/12/15 1152  BP: (!) 90/42 (!) 78/32  Pulse: 84 79  Resp: 20   Temp: (!) 101.6 F (38.7 C) 99.4 F (37.4 C)   GENERAL:82 y.o.male critically ill HEAD: Normocephalic, atraumatic.  EYES: Pupils equal, round, reactive to light. Unable to assess extraocular muscles given mental status/medical condition. No scleral icterus.  MOUTH: dry mucosal membrane. Dentition intact. No abscess noted.  EAR, NOSE, THROAT: Clear without exudates. No external lesions.  NECK: Supple. No thyromegaly. No nodules. No JVD.  PULMONARY: Clear to ascultation, without wheeze rails or rhonci. No use of accessory muscles, Good respiratory effort. good air entry bilaterally CHEST: Nontender to palpation.  CARDIOVASCULAR: S1 and S2. Regular rate and rhythm. No murmurs, rubs, or gallops. No edema. Pedal pulses 2+ bilaterally.  GASTROINTESTINAL: Soft, nontender, nondistended. No masses. Positive bowel sounds. No hepatosplenomegaly.  MUSCULOSKELETAL: No swelling, clubbing, or edema. Range of motion full in all extremities.  NEUROLOGIC: Unable to assess given mental status/medical condition SKIN: No ulceration, lesions,  rashes, or cyanosis. Skin hot and dry. Turgor intact.  PSYCHIATRIC: Unable to assess given mental status/medical condition     LABORATORY PANEL:   CBC  Recent Labs Lab 11/11/15 2211  WBC 9.7  HGB 9.1*  HCT 26.6*  PLT 320   ------------------------------------------------------------------------------------------------------------------  Chemistries   Recent Labs Lab 11/11/15 2211  NA 134*  K 4.6  CL 107  CO2 17*  GLUCOSE 132*  BUN 81*  CREATININE 1.51*  CALCIUM 8.9  AST 23  ALT 14*  ALKPHOS 68  BILITOT 0.3   ------------------------------------------------------------------------------------------------------------------  Cardiac Enzymes No results for input(s): TROPONINI in the last 168 hours. ------------------------------------------------------------------------------------------------------------------  RADIOLOGY:  Ct Abdomen Pelvis W Contrast  Result Date: 11/12/2015 CLINICAL DATA:  Emesis and abnormally colored stools. EXAM: CT ABDOMEN AND PELVIS WITH CONTRAST TECHNIQUE: Multidetector CT imaging of the abdomen and pelvis was performed using the standard protocol following bolus administration of intravenous contrast. CONTRAST:  27mL ISOVUE-300 IOPAMIDOL (ISOVUE-300) INJECTION 61% COMPARISON:  CT abdomen 09/06/2011 FINDINGS: Lower chest: There is a moderate hiatal hernia, increased in size to the prior examination. There is mild luminal enhancement and possible wall edema. There is lingular atelectasis. No pulmonary nodules. No visible pleural or pericardial effusion. There is aortic and coronary artery calcification. Hepatobiliary: Normal hepatic size and contours without focal liver lesion. No perihepatic ascites. No intra- or extrahepatic biliary dilatation. Normal gallbladder. Pancreas: There are multiple small pancreatic calcifications. The pancreas is otherwise normal. Spleen: Normal. Adrenals/Urinary Tract: The adrenal glands are normal. There is bilateral  lobulated, mildly atrophic appearance of both kidneys. There are multiple large bilateral renal cysts, measuring up to 4.4 centimeters. There is no solid renal mass visualized.  The patient is status post cystectomy with formation of a right lower quadrant conduit. There is moderate bilateral hydronephrosis and hydroureter. Delayed phase imaging does not show adequate opacification of the ureters. Stomach/Bowel: There is a left inguinal hernia containing a loop of nondilated sigmoid colon. There is no evidence of small-bowel obstruction. No evidence of acute inflammation. No fluid collection within the abdomen. Vascular/Lymphatic: There is atherosclerosis of the aorta and branch vessels. No aortic aneurysm. No abdominal or pelvic adenopathy. Reproductive: Prostate is surgically absent. No pelvic fluid collection. Musculoskeletal: Multilevel lumbar facet arthrosis and thoracolumbar osteophytosis. No focal lytic or blastic osseous lesions. The visualized extraperitoneal and extrathoracic soft tissues are normal. Other: No contributory non-categorized findings. IMPRESSION: 1. Left inguinal hernia, containing a portion of nondilated sigmoid colon, new from the prior study. 2. Moderate-sized hiatal hernia with mild luminal enhancement and possible wall thickening. Correlate for signs of esophagitis. 3. Aortic and coronary artery atherosclerotic calcification. 4. Status post cystectomy with right lower quadrant conduit with bilateral expected hydronephrosis. Lack of opacification of the collecting systems on the delayed phase sequences may be due to timing, versus diminished renal function. Electronically Signed   By: Ulyses Jarred M.D.   On: 11/12/2015 00:34    EKG:   Orders placed or performed during the hospital encounter of 11/11/15  . EKG 12-Lead  . EKG 12-Lead  . ED EKG  . ED EKG    ASSESSMENT AND PLAN:   Maximino Alcorn is a 82 y.o. male presenting with Diarrhea and Emesis . Admitted 11/11/2015 : Day #:  0 days  1. Sepsis, meeting septic criteria by temp, hr. Source urine, Code sepsis called. Panculture. Broad-spectrum antibiotics including vanc/zosyn and taper antibiotics when culture data returns.  ordered to receive a 30 mL/kg IV fluid bolus. Continue IV fluid hydration to keep mean arterial pressure greater than 65. may require pressor therapy if blood pressure worsens. Check lactic acid and repeat if the initial is greater than 2.2.   Discuss with family: confirm DNR status but would allow pressors/central line if required   2. Aki: IVf, follow urine output 3. GIB: hgb stable, add protonix iv     All the records are reviewed and case discussed with Care Management/Social Workerr. Management plans discussed with the patient, family and they are in agreement.  CODE STATUS: dnr TOTAL TIME TAKING CARE OF THIS PATIENT: 74 critical minutes.   POSSIBLE D/C IN 2-3DAYS, DEPENDING ON CLINICAL CONDITION.   Elgie Maziarz,  Karenann Cai.D on 11/12/2015 at 12:08 PM  Between 7am to 6pm - Pager - [phone]  After 6pm: House Pager: - [facility]  Office  [phone]  CC: Primary care physician; Kirk Ruths., MD

## 2015-11-12 NOTE — Progress Notes (Signed)
eLink Physician-Brief Progress Note Patient Name: LACEDRIC GAUGH DOB: [birth year] MRN: [id number]   Date of Service  11/12/2015  HPI/Events of Note  Rn notes hb=6.9, pt s/p 2L bolus.   eICU Interventions  May be dilutional. Recheck now, asked to call back if Hb<7.     Intervention Category Major Interventions: Hypotension - evaluation and management  Laverle Hobby 11/12/2015, 7:32 PM

## 2015-11-12 NOTE — Progress Notes (Signed)
Pharmacy Antibiotic Note  Shawn Lyons is a 82 y.o. male admitted on 11/11/2015 with UTI/sepsis.  Pharmacy has been consulted for Zosyn and vancomycin dosing.  Plan: After discussion with Dr. Lavetta Nielsen, will d/c vancomycin as suspected source is urinary.  Continue Zosyn 3.375 g EI q 8 hours.   Height: 5\' 5"  (165.1 cm) Weight: 137 lb 5.6 oz (62.3 kg) IBW/kg (Calculated) : 61.5  Temp (24hrs), Avg:100 F (37.8 C), Min:98.5 F (36.9 C), Max:101.6 F (38.7 C)   Recent Labs Lab 11/11/15 2211 11/12/15 1349 11/12/15 1451  WBC 9.7 6.7  --   CREATININE 1.51* 1.41*  --   LATICACIDVEN  --  1.2 1.5    Estimated Creatinine Clearance: 27.9 mL/min (by C-G formula based on SCr of 1.41 mg/dL).    Allergies  Allergen Reactions  . Augmentin [Amoxicillin-Pot Clavulanate] Nausea Only  . Prilosec [Omeprazole] Diarrhea   Thank you for allowing pharmacy to be a part of this patient's care.  Ulice Dash D, Pharm.D., BCPS Clinical Pharmacist 11/12/2015 4:35 PM

## 2015-11-12 NOTE — Care Management Note (Signed)
Case Management Note  Patient Details  Name: Shawn Lyons MRN: [id number] Date of Birth: Apr 03, 1919  Subjective/Objective:                    Action/Plan: Patient is from [facility] and plan is for return when medically stable. CSW following case will sign off.  Expected Discharge Date:                  Expected Discharge Plan:  Skilled Nursing Facility  In-House Referral:  Clinical Social Work  Discharge planning Services  CM Consult  Post Acute Care Choice:    Choice offered to:     DME Arranged:    DME Agency:     HH Arranged:    [place] Agency:     Status of Service:  Completed, signed off  If discussed at [employer], dates discussed:    Additional Comments:  Alvie Heidelberg, RN 11/12/2015, 11:32 AM

## 2015-11-12 NOTE — Progress Notes (Signed)
Initial Nutrition Assessment  DOCUMENTATION CODES:   Not applicable  INTERVENTION:  -Await diet progression, recommend nutritional supplement once diet advanced  NUTRITION DIAGNOSIS:   Inadequate oral intake related to altered GI function, acute illness as evidenced by NPO status.  GOAL:   Patient will meet greater than or equal to 90% of their needs  MONITOR:   Diet advancement, Labs, Weight trends  REASON FOR ASSESSMENT:   Malnutrition Screening Tool    ASSESSMENT:    82 yo male admitted with GI bleed, AKI. Pt with hx of dementia  Pt unresponsive today, hypotensive, septic, transferred to ICU  NPO at present  Per weight encounters, pt with gradual wt loss, 3.5% wt loss in about 1 year, 6.2% wt loss in 13-14 months  Unable to complete Nutrition-Focused physical exam at this time.   Past Medical History:  Diagnosis Date  . Arthritis   . Cancer [facility])    h/o bladder cancer  . Coronary artery disease   . Depression   . Vitamin B12 deficiency     Diet Order:  Diet NPO time specified Except for: Sips with Meds  Skin:  Reviewed, no issues  Last BM:  8/28  Labs: sodium 134  Meds: D5-NS at 100 ml/hr, remeron  Height:   Ht Readings from Last 1 Encounters:  11/12/15 5\' 5"  (1.651 m)    Weight:   Wt Readings from Last 1 Encounters:  11/12/15 136 lb 12.8 oz (62.1 kg)    Wt Readings from Last 10 Encounters:  11/12/15 136 lb 12.8 oz (62.1 kg)  12/08/14 141 lb 4.8 oz (64.1 kg)  09/05/14 145 lb 6.4 oz (66 kg)  08/22/14 145 lb (65.8 kg)  08/15/14 145 lb (65.8 kg)  08/12/14 145 lb (65.8 kg)  07/25/14 143 lb (64.9 kg)  01/02/08 151 lb 12.8 oz (68.9 kg)  12/01/07 156 lb (70.8 kg)    BMI:  Body mass index is 22.76 kg/m.  Estimated Nutritional Needs:   Kcal:  1600-1900 kcals   Protein:  75-100 g  Fluid:  >/= 1.6 L  EDUCATION NEEDS:   No education needs identified at this time  [place], [place], [place] [phone] Pager  [phone]  Weekend/On-Call Pager

## 2015-11-12 NOTE — Progress Notes (Signed)
Pt unresponsive, VS rechecked. Hypotensive at 78/32, temp at 99.4. MD notified. 511mL fluid bolus ordered and administered. Transfer to stepdown unit for further care. Family notified. Awaiting bed request to transfer pt.

## 2015-11-12 NOTE — Progress Notes (Signed)
Report given to [place]. Pt transferring to Rm 16. RN/NT transporting now.

## 2015-11-13 LAB — CBC
HCT: 19.6 % — ABNORMAL LOW (ref 40.0–52.0)
HEMOGLOBIN: 6.7 g/dL — AB (ref 13.0–18.0)
MCH: 29.8 pg (ref 26.0–34.0)
MCHC: 34 g/dL (ref 32.0–36.0)
MCV: 87.5 fL (ref 80.0–100.0)
PLATELETS: 240 10*3/uL (ref 150–440)
RBC: 2.23 MIL/uL — ABNORMAL LOW (ref 4.40–5.90)
RDW: 15.8 % — ABNORMAL HIGH (ref 11.5–14.5)
WBC: 6.9 10*3/uL (ref 3.8–10.6)

## 2015-11-13 LAB — URINE CULTURE

## 2015-11-13 LAB — BASIC METABOLIC PANEL
Anion gap: 3 — ABNORMAL LOW (ref 5–15)
BUN: 48 mg/dL — AB (ref 6–20)
CO2: 19 mmol/L — ABNORMAL LOW (ref 22–32)
Calcium: 8.1 mg/dL — ABNORMAL LOW (ref 8.9–10.3)
Chloride: 120 mmol/L — ABNORMAL HIGH (ref 101–111)
Creatinine, Ser: 1.25 mg/dL — ABNORMAL HIGH (ref 0.61–1.24)
GFR calc Af Amer: 55 mL/min — ABNORMAL LOW (ref >60)
GFR, EST NON AFRICAN AMERICAN: 47 mL/min — AB (ref >60)
GLUCOSE: 109 mg/dL — AB (ref 65–99)
POTASSIUM: 3.7 mmol/L (ref 3.5–5.1)
Sodium: 142 mmol/L (ref 135–145)

## 2015-11-13 LAB — PREPARE RBC (CROSSMATCH)

## 2015-11-13 LAB — ABO/RH: ABO/RH(D): O POS

## 2015-11-13 MED ORDER — SODIUM CHLORIDE 0.9 % IV SOLN
Freq: Once | INTRAVENOUS | Status: AC
  Administered 2015-11-13: 08:00:00 via INTRAVENOUS

## 2015-11-13 MED ORDER — CHLORHEXIDINE GLUCONATE CLOTH 2 % EX PADS
6.0000 | MEDICATED_PAD | Freq: Every day | CUTANEOUS | Status: DC
  Administered 2015-11-14 – 2015-11-15 (×2): 6 via TOPICAL

## 2015-11-13 MED ORDER — MUPIROCIN 2 % EX OINT
1.0000 "application " | TOPICAL_OINTMENT | Freq: Two times a day (BID) | CUTANEOUS | Status: DC
  Administered 2015-11-13 – 2015-11-15 (×4): 1 via NASAL
  Filled 2015-11-13: qty 22

## 2015-11-13 NOTE — Consult Note (Signed)
GI Inpatient Consult Note  Reason for Consult: coffee ground emesis, melena,    Attending Requesting Consult: Dr. Lavetta Nielsen  History of Present Illness: Shawn Lyons is a 82 y.o. male seen for evaluation of melena and coffee ground emesis at the request of Dr. Lavetta Nielsen. Found to have low BP due to emesis and diarrhea so family brought him to ER and reported melena.  Daughter at bedside and helps w/ history. Denies any prior history of GI bleed. Reports acutely about 6 days ago developed nausea/vomiting, diarrhea. Attempted to give 1L fluids at nursing home nad BP continued to be in 94T systolics so brought to ER. Since admitted he has not had further vomiting. No BM in past 24 hours. Does not complain of abdominal pain. Has chronic GERD despite taking either Prilosec or Aciphex as outpatient. Does not use NSAIDs. Denies obvious dysphagia when eating. Weight down 8lbs in past 6 months due to poor appetite/alzheimers.  Was on plavix many years ago following MI but no anti-coag or antiplatelet therapy currently except baby aspirin.   Last Colonoscopy: 2006-Dr. Siegal-normal Last Endoscopy: 2003-small HH otherwise normal.    Past Medical History:  Past Medical History:  Diagnosis Date  . Arthritis   . Cancer [facility])    h/o bladder cancer  . Coronary artery disease   . Depression   . Vitamin B12 deficiency     Problem List: Patient Active Problem List   Diagnosis Date Noted  . GI bleed 11/12/2015  . Sepsis ([place]) 12/08/2014  . Hyponatremia 12/08/2014  . HYPERLIPIDEMIA 12/04/2007  . CHRONIC OBSTRUCTIVE ASTHMA UNSPECIFIED 12/04/2007  . SLEEP APNEA 12/04/2007  . CORONARY HEART DISEASE 12/01/2007    Past Surgical History: Past Surgical History:  Procedure Laterality Date  . EYE SURGERY    . HERNIA REPAIR    . KNEE ARTHROSCOPY Left 2006  . MOHS SURGERY  2012  . REVISION UROSTOMY CUTANEOUS  2000    Allergies: Allergies  Allergen Reactions  . Augmentin [Amoxicillin-Pot Clavulanate]  Nausea Only  . Prilosec [Omeprazole] Diarrhea    Home Medications: Prescriptions Prior to Admission  Medication Sig Dispense Refill Last Dose  . acetaminophen (TYLENOL) 500 MG tablet Take 1,000 mg by mouth every 4 (four) hours as needed for moderate pain (max of 4074m per 24  hours).   prn at prn  . acetaminophen (TYLENOL) 650 MG CR tablet Take 650 mg by mouth every 8 (eight) hours.   11/11/2015 at Unknown time  . albuterol (PROVENTIL HFA;VENTOLIN HFA) 108 (90 BASE) MCG/ACT inhaler Inhale 1 puff into the lungs every 4 (four) hours as needed for wheezing.   prn at prn  . albuterol (PROVENTIL) (2.5 MG/3ML) 0.083% nebulizer solution Take 2.5 mg by nebulization 3 (three) times daily as needed for wheezing or shortness of breath.   prn at prn  . aspirin EC 81 MG tablet Take 81 mg by mouth at bedtime.    Past Week at Unknown time  . cholecalciferol (VITAMIN D) 1000 UNITS tablet Take 1,000 Units by mouth daily.   11/11/2015 at Unknown time  . Cyanocobalamin 1000 MCG/ML KIT Inject 1 mL as directed every 30 (thirty) days. Around the 28th day of the month   11/11/2015 at Unknown time  . DULoxetine (CYMBALTA) 60 MG capsule Take 60 mg by mouth daily.   11/11/2015 at Unknown time  . furosemide (LASIX) 20 MG tablet Take 20 mg by mouth daily as needed for edema.    prn at prn  . guaiFENesin-dextromethorphan (ROBITUSSIN  DM) 100-10 MG/5ML syrup Take 5 mLs by mouth every 4 (four) hours as needed for cough.   prn at prn  . hydroxypropyl methylcellulose / hypromellose (ISOPTO TEARS / GONIOVISC) 2.5 % ophthalmic solution Place 1 drop into both eyes every 2 (two) hours as needed for dry eyes.   prn at prn  . lansoprazole (PREVACID) 30 MG capsule Take 30 mg by mouth daily at 12 noon.   unknown at unknown  . mirtazapine (REMERON) 30 MG tablet Take 30 mg by mouth at bedtime.    Past Week at Unknown time  . nitroGLYCERIN (NITROSTAT) 0.4 MG SL tablet Place 0.4 mg under the tongue every 5 (five) minutes as needed for chest  pain.   prn at prn  . polyethylene glycol (MIRALAX / GLYCOLAX) packet Take 17 g by mouth daily.    11/11/2015 at Unknown time  . simvastatin (ZOCOR) 40 MG tablet Take 40 mg by mouth at bedtime.   Past Week at Unknown time   Home medication reconciliation was completed with the patient.   Scheduled Inpatient Medications:   . cholecalciferol  1,000 Units Oral Daily  . DULoxetine  60 mg Oral Daily  . pantoprazole (PROTONIX) IV  40 mg Intravenous Q12H  . piperacillin-tazobactam (ZOSYN)  IV  3.375 g Intravenous Q8H  . simvastatin  40 mg Oral QHS    Continuous Inpatient Infusions:   . dextrose 5 % and 0.9% NaCl 100 mL/hr at 11/13/15 1100    PRN Inpatient Medications:  acetaminophen **OR** acetaminophen, guaiFENesin-dextromethorphan, ondansetron **OR** ondansetron (ZOFRAN) IV, polyvinyl alcohol, polyvinyl alcohol  Family History: family history includes Heart attack in his father.    Social History:   reports that he has quit smoking. He has never used smokeless tobacco. He reports that he does not drink alcohol or use drugs.    Review of Systems: Constitutional: + weight loss.  Eyes: No changes in vision. ENT: No oral lesions, sore throat.  GI: see HPI.  Heme/Lymph: No easy bruising.  CV: No chest pain.  GU: No hematuria.  Integumentary: No rashes.  Neuro: No headaches.  Psych: No depression/anxiety.  Endocrine: No heat/cold intolerance.  Allergic/Immunologic: No urticaria.  Resp: No cough, SOB.  Musculoskeletal: No joint swelling.    Physical Examination: BP (!) 104/44   Pulse 80   Temp 99.6 F (37.6 C) (Oral)   Resp 18   Ht 5\' 5"  (1.651 m)   Wt 62.5 kg (137 lb 12.6 oz)   SpO2 100%   BMI 22.93 kg/m  Gen: NAD, alert and oriented x 4 HEENT: PEERLA, EOMI, Neck: supple, no JVD or thyromegaly Chest: CTA bilaterally, no wheezes, crackles, or other adventitious sounds CV: RRR, no m/g/c/r Abd: soft, NT, ND, +BS in all four quadrants; no HSM, guarding, ridigity, or  rebound tenderness Rectal - light brown streaking on rectal exam.  Ext: no edema, well perfused with 2+ pulses, Skin: no rash or lesions noted Lymph: no LAD  Data: Lab Results  Component Value Date   WBC 6.9 11/13/2015   HGB 6.7 (L) 11/13/2015   HCT 19.6 (L) 11/13/2015   MCV 87.5 11/13/2015   PLT 240 11/13/2015    Recent Labs Lab 11/12/15 1349 11/12/15 2032 11/13/15 0408  HGB 6.9* 7.6* 6.7*   Lab Results  Component Value Date   NA 142 11/13/2015   K 3.7 11/13/2015   CL 120 (H) 11/13/2015   CO2 19 (L) 11/13/2015   BUN 48 (H) 11/13/2015   CREATININE 1.25 (H) 11/13/2015  Lab Results  Component Value Date   ALT 14 (L) 11/11/2015   AST 23 11/11/2015   ALKPHOS 68 11/11/2015   BILITOT 0.3 11/11/2015    Recent Labs Lab 11/12/15 1349  APTT 37*  INR 1.25    CT a/p: IMPRESSION: 11/12/15 1. Left inguinal hernia, containing a portion of nondilated sigmoid colon, new from the prior study. 2. Moderate-sized hiatal hernia with mild luminal enhancement and possible wall thickening. Correlate for signs of esophagitis. 3. Aortic and coronary artery atherosclerotic calcification. 4. Status post cystectomy with right lower quadrant conduit with bilateral expected hydronephrosis. Lack of opacification of the collecting systems on the delayed phase sequences may be due to timing, versus diminished renal function.  Assessment/Plan: Mr. Duchemin is a 82 y.o. male anemia, emesis, diarrhea, hypotension, weakness.   1. Anemia - acute blood loss, Hgb on admission was 9.1 (his baseline per chart review compared to 2016), has dropped to 6.9, MCV normal. Has been getting fluids for hypotension so may be partial dilutional. Elevated BUN/Cr ratio. Both melena and coffee ground emesis which have been resolved x 24 hours. Symptoms clinically resolving and brown stool in color. Given advanced age will use conservative management - daughter in agreement w/ this. Check H. Pylori stool antigen.  Monitor serial H/H. Continue PPI. Consider EGD if clinically worsens but high risk candidate given his age and recent sepsis.     Recommendations: 1. Continue protonix IV BID 2. Serial H/H 3. H. Pylori stool antigen 4. Consider EGD only if clinically worsens w/ recurrent symptoms.   *case discussed w/ Dr. Gustavo Lah.   Thank you for the consult. Please call with questions or concerns.  Laurine Blazer, PA-C [place]

## 2015-11-13 NOTE — Consult Note (Signed)
Please see full GI consult by Ms. Woodard. Patient is a 82 year old male admitted with reported emesis, diarrhea, hypotension and weakness. Felt to have urosepsis. Currently on antibiotics. He has had no repeat episode of emesis or diarrhea since admission. He did receive a large amount of volume yesterday to support his blood pressure. On rectal examination his stool was light brown not melanotic. He has been hemodynamically stable today. Case was discussed with patient's daughter. At this point there is no desire for sedated luminal evaluation. Recommend Helicobacter pylori testing continue PPI as you are. We'll consider a upper GI series depending on how he is doing clinically and a day or so. We'll follow with you.

## 2015-11-13 NOTE — Progress Notes (Signed)
[facility] at [place] NAME: Shawn Lyons    MRN#:  [id number]  DATE OF BIRTH:  Oct 30, 1919  SUBJECTIVE:  Hospital Day: 1 day Shawn Lyons is a 82 y.o. male presenting with Diarrhea and Emesis .   Overnight events: Transferred to ICU, no overnight events Interval Events: Mental status back to baseline, blood pressure improved, but anemic this morning  REVIEW OF SYSTEMS:  Unable to obtain given mental status  DRUG ALLERGIES:   Allergies  Allergen Reactions  . Augmentin [Amoxicillin-Pot Clavulanate] Nausea Only  . Prilosec [Omeprazole] Diarrhea    VITALS:  Blood pressure (!) 104/44, pulse 80, temperature 99.6 F (37.6 C), temperature source Oral, resp. rate 18, height 5\' 5"  (1.651 m), weight 62.5 kg (137 lb 12.6 oz), SpO2 100 %.  PHYSICAL EXAMINATION:   VITAL SIGNS: Vitals:   11/13/15 1200 11/13/15 1203  BP: (!) 104/44   Pulse: 79 80  Resp: 19 18  Temp: 99.6 F (37.6 C)    GENERAL:82 y.o.male critically ill HEAD: Normocephalic, atraumatic.  EYES: Pupils equal, round, reactive to light. Unable to assess extraocular muscles given mental status/medical condition. No scleral icterus.  MOUTH: dry mucosal membrane. Dentition intact. No abscess noted.  EAR, NOSE, THROAT: Clear without exudates. No external lesions.  NECK: Supple. No thyromegaly. No nodules. No JVD.  PULMONARY: Clear to ascultation, without wheeze rails or rhonci. No use of accessory muscles, Good respiratory effort. good air entry bilaterally CHEST: Nontender to palpation.  CARDIOVASCULAR: S1 and S2. Regular rate and rhythm. No murmurs, rubs, or gallops. No edema. Pedal pulses 2+ bilaterally.  GASTROINTESTINAL: Soft, nontender, nondistended. No masses. Positive bowel sounds. No hepatosplenomegaly.  MUSCULOSKELETAL: No swelling, clubbing, or edema. Range of motion full in all extremities.  NEUROLOGIC: Unable to assess given mental status/medical  condition SKIN: No ulceration, lesions, rashes, or cyanosis. Skin hot and dry. Turgor intact.  PSYCHIATRIC: Unable to assess given mental status/medical condition     LABORATORY PANEL:   CBC  Recent Labs Lab 11/13/15 0408  WBC 6.9  HGB 6.7*  HCT 19.6*  PLT 240   ------------------------------------------------------------------------------------------------------------------  Chemistries   Recent Labs Lab 11/11/15 2211  11/13/15 0408  NA 134*  --  142  K 4.6  --  3.7  CL 107  --  120*  CO2 17*  --  19*  GLUCOSE 132*  --  109*  BUN 81*  --  48*  CREATININE 1.51*  < > 1.25*  CALCIUM 8.9  --  8.1*  AST 23  --   --   ALT 14*  --   --   ALKPHOS 68  --   --   BILITOT 0.3  --   --   < > = values in this interval not displayed. ------------------------------------------------------------------------------------------------------------------  Cardiac Enzymes No results for input(s): TROPONINI in the last 168 hours. ------------------------------------------------------------------------------------------------------------------  RADIOLOGY:  Ct Abdomen Pelvis W Contrast  Result Date: 11/12/2015 CLINICAL DATA:  Emesis and abnormally colored stools. EXAM: CT ABDOMEN AND PELVIS WITH CONTRAST TECHNIQUE: Multidetector CT imaging of the abdomen and pelvis was performed using the standard protocol following bolus administration of intravenous contrast. CONTRAST:  72mL ISOVUE-300 IOPAMIDOL (ISOVUE-300) INJECTION 61% COMPARISON:  CT abdomen 09/06/2011 FINDINGS: Lower chest: There is a moderate hiatal hernia, increased in size to the prior examination. There is mild luminal enhancement and possible wall edema. There is lingular atelectasis. No pulmonary nodules. No visible pleural or pericardial effusion. There is aortic and coronary artery  calcification. Hepatobiliary: Normal hepatic size and contours without focal liver lesion. No perihepatic ascites. No intra- or extrahepatic biliary  dilatation. Normal gallbladder. Pancreas: There are multiple small pancreatic calcifications. The pancreas is otherwise normal. Spleen: Normal. Adrenals/Urinary Tract: The adrenal glands are normal. There is bilateral lobulated, mildly atrophic appearance of both kidneys. There are multiple large bilateral renal cysts, measuring up to 4.4 centimeters. There is no solid renal mass visualized. The patient is status post cystectomy with formation of a right lower quadrant conduit. There is moderate bilateral hydronephrosis and hydroureter. Delayed phase imaging does not show adequate opacification of the ureters. Stomach/Bowel: There is a left inguinal hernia containing a loop of nondilated sigmoid colon. There is no evidence of small-bowel obstruction. No evidence of acute inflammation. No fluid collection within the abdomen. Vascular/Lymphatic: There is atherosclerosis of the aorta and branch vessels. No aortic aneurysm. No abdominal or pelvic adenopathy. Reproductive: Prostate is surgically absent. No pelvic fluid collection. Musculoskeletal: Multilevel lumbar facet arthrosis and thoracolumbar osteophytosis. No focal lytic or blastic osseous lesions. The visualized extraperitoneal and extrathoracic soft tissues are normal. Other: No contributory non-categorized findings. IMPRESSION: 1. Left inguinal hernia, containing a portion of nondilated sigmoid colon, new from the prior study. 2. Moderate-sized hiatal hernia with mild luminal enhancement and possible wall thickening. Correlate for signs of esophagitis. 3. Aortic and coronary artery atherosclerotic calcification. 4. Status post cystectomy with right lower quadrant conduit with bilateral expected hydronephrosis. Lack of opacification of the collecting systems on the delayed phase sequences may be due to timing, versus diminished renal function. Electronically Signed   By: Ulyses Jarred M.D.   On: 11/12/2015 00:34    EKG:   Orders placed or performed during  the hospital encounter of 11/11/15  . EKG 12-Lead  . EKG 12-Lead  . ED EKG  . ED EKG    ASSESSMENT AND PLAN:   Shawn Lyons is a 82 y.o. male presenting with Diarrhea and Emesis . Admitted 11/11/2015 : Day #: 1 day  1. Sepsis, meeting septic criteria by temp, hr. Source urine, Continue Zosyn, follow culture data 2. Aki: IVf, follow urine output 3. GIB: IV Protonix, drop in hemoglobin, now less than 7- transfuse     All the records are reviewed and case discussed with Care Management/Social Workerr. Management plans discussed with the patient, family and they are in agreement.  CODE STATUS: dnr TOTAL TIME TAKING CARE OF THIS PATIENT: 33 critical minutes.   POSSIBLE D/C IN 2-3DAYS, DEPENDING ON CLINICAL CONDITION.   Hower,  Karenann Cai.D on 11/13/2015 at 12:27 PM  Between 7am to 6pm - Pager - [phone]  After 6pm: House Pager: - [place] [facility]  Office  [phone]  CC: Primary care physician; Kirk Ruths., MD

## 2015-11-13 NOTE — Progress Notes (Signed)
Pharmacy Antibiotic Note  Shawn Lyons is a 82 y.o. male admitted on 11/11/2015 with UTI/sepsis.  Pharmacy has been consulted for Zosyn dosing.  Plan: Continue Zosyn 3.375 g EI q 8 hours.   Height: 5\' 5"  (165.1 cm) Weight: 137 lb 12.6 oz (62.5 kg) IBW/kg (Calculated) : 61.5  Temp (24hrs), Avg:98.9 F (37.2 C), Min:98.4 F (36.9 C), Max:99.4 F (37.4 C)   Recent Labs Lab 11/11/15 2211 11/12/15 1349 11/12/15 1451 11/13/15 0408  WBC 9.7 6.7  --  6.9  CREATININE 1.51* 1.41*  --  1.25*  LATICACIDVEN  --  1.2 1.5  --     Estimated Creatinine Clearance: 31.4 mL/min (by C-G formula based on SCr of 1.25 mg/dL).    Allergies  Allergen Reactions  . Augmentin [Amoxicillin-Pot Clavulanate] Nausea Only  . Prilosec [Omeprazole] Diarrhea   Recent Results (from the past 240 hour(s))  MRSA PCR Screening     Status: Abnormal   Collection Time: 11/12/15  3:59 AM  Result Value Ref Range Status   MRSA by PCR POSITIVE (A) NEGATIVE Final    Comment:        The GeneXpert MRSA Assay (FDA approved for NASAL specimens only), is one component of a comprehensive MRSA colonization surveillance program. It is not intended to diagnose MRSA infection nor to guide or monitor treatment for MRSA infections. CRITICAL RESULT CALLED TO, READ BACK BY AND VERIFIED WITH: LESLIE NELSON@[email or web address]  ON 11/12/15 BY [facility]     Anti-infectives    Start     Dose/Rate Route Frequency Ordered Stop   11/12/15 1215  piperacillin-tazobactam (ZOSYN) IVPB 3.375 g  Status:  Discontinued     3.375 g 100 mL/hr over 30 Minutes Intravenous  Once 11/12/15 1201 11/12/15 1226   11/12/15 1215  vancomycin (VANCOCIN) IVPB 1000 mg/200 mL premix  Status:  Discontinued     1,000 mg 200 mL/hr over 60 Minutes Intravenous  Once 11/12/15 1201 11/12/15 1225   11/12/15 0700  metroNIDAZOLE (FLAGYL) tablet 500 mg  Status:  Discontinued     500 mg Oral Every 8 hours 11/12/15 0647 11/12/15 0859   11/12/15 0700  vancomycin (VANCOCIN) IVPB  1000 mg/200 mL premix     1,000 mg 200 mL/hr over 60 Minutes Intravenous  Once 11/12/15 0647 11/12/15 0926   11/12/15 0700  piperacillin-tazobactam (ZOSYN) IVPB 3.375 g     3.375 g 12.5 mL/hr over 240 Minutes Intravenous Every 8 hours 11/12/15 0657     11/12/15 0300  cefTRIAXone (ROCEPHIN) 1 g in dextrose 5 % 50 mL IVPB  Status:  Discontinued     1 g 100 mL/hr over 30 Minutes Intravenous Daily at bedtime 11/12/15 0202 11/12/15 0649     Thank you for allowing pharmacy to be a part of this patient's care.  Ulice Dash D, Pharm.D., BCPS Clinical Pharmacist 11/13/2015 9:22 AM

## 2015-11-14 LAB — TYPE AND SCREEN
ABO/RH(D): O POS
ANTIBODY SCREEN: NEGATIVE
Unit division: 0
Unit division: 0

## 2015-11-14 LAB — CBC WITH DIFFERENTIAL/PLATELET
Basophils Absolute: 0 10*3/uL (ref 0–0.1)
EOS ABS: 0.3 10*3/uL (ref 0–0.7)
HEMATOCRIT: 27.7 % — AB (ref 40.0–52.0)
Hemoglobin: 9.4 g/dL — ABNORMAL LOW (ref 13.0–18.0)
Lymphocytes Relative: 15 %
Lymphs Abs: 1.3 10*3/uL (ref 1.0–3.6)
MCH: 30 pg (ref 26.0–34.0)
MCHC: 34 g/dL (ref 32.0–36.0)
MCV: 88.4 fL (ref 80.0–100.0)
MONO ABS: 0.7 10*3/uL (ref 0.2–1.0)
NEUTROS ABS: 5.9 10*3/uL (ref 1.4–6.5)
Neutrophils Relative %: 73 %
PLATELETS: 227 10*3/uL (ref 150–440)
RBC: 3.14 MIL/uL — ABNORMAL LOW (ref 4.40–5.90)
RDW: 15 % — AB (ref 11.5–14.5)
WBC: 8.2 10*3/uL (ref 3.8–10.6)

## 2015-11-14 LAB — GLUCOSE, CAPILLARY: Glucose-Capillary: 118 mg/dL — ABNORMAL HIGH (ref 65–99)

## 2015-11-14 MED ORDER — CIPROFLOXACIN HCL 250 MG PO TABS
250.0000 mg | ORAL_TABLET | Freq: Two times a day (BID) | ORAL | Status: DC
  Administered 2015-11-14 – 2015-11-15 (×3): 250 mg via ORAL
  Filled 2015-11-14 (×3): qty 1

## 2015-11-14 MED ORDER — PANTOPRAZOLE SODIUM 40 MG PO TBEC
40.0000 mg | DELAYED_RELEASE_TABLET | Freq: Two times a day (BID) | ORAL | Status: DC
  Administered 2015-11-14 – 2015-11-15 (×2): 40 mg via ORAL
  Filled 2015-11-14 (×2): qty 1

## 2015-11-14 NOTE — Progress Notes (Signed)
[name] at [place] NAME: Shawn Lyons    MRN#:  [id number]  DATE OF BIRTH:  Jun 06, 1919  SUBJECTIVE:  Hospital Shawn: 2 days Shawn Lyons is a 82 y.o. male presenting with Diarrhea and Emesis  Received 2 units packed RBC yesterday. No bowel movements or vomiting. Patient awake but pleasantly confused. Afebrile.  REVIEW OF SYSTEMS:  Unable to obtain given mental status  DRUG ALLERGIES:   Allergies  Allergen Reactions  . Augmentin [Amoxicillin-Pot Clavulanate] Nausea Only  . Prilosec [Omeprazole] Diarrhea    VITALS:  Blood pressure 139/69, pulse 80, temperature 99 F (37.2 C), temperature source Axillary, resp. rate 17, height 5\' 5"  (1.651 m), weight 62.5 kg (137 lb 12.6 oz), SpO2 100 %.  PHYSICAL EXAMINATION:   VITAL SIGNS: Vitals:   11/14/15 0800 11/14/15 0900  BP: 136/60 139/69  Pulse: 71 80  Resp: 15 17  Temp: 99 F (37.2 C)    GENERAL:82 y.o.male critically ill HEAD: Normocephalic, atraumatic.  EYES: Pupils equal, round, reactive to light. Unable to assess extraocular muscles given mental status/medical condition. No scleral icterus. Pallor positive MOUTH: dry mucosal membrane. Dentition intact. No abscess noted.  EAR, NOSE, THROAT: Clear without exudates. No external lesions.  NECK: Supple. No thyromegaly. No nodules. No JVD.  PULMONARY: Clear to ascultation, without wheeze rails or rhonci. No use of accessory muscles, Good respiratory effort. good air entry bilaterally CHEST: Nontender to palpation.  CARDIOVASCULAR: S1 and S2. Regular rate and rhythm. No murmurs, rubs, or gallops. No edema. Pedal pulses 2+ bilaterally.  GASTROINTESTINAL: Soft, nontender, nondistended. No masses. Positive bowel sounds. No hepatosplenomegaly.  MUSCULOSKELETAL: No swelling, clubbing, or edema. Range of motion full in all extremities.  NEUROLOGIC: Unable to assess given mental status/medical condition SKIN: No ulceration,  lesions, rashes, or cyanosis. Skin hot and dry. Turgor intact.  PSYCHIATRIC: Unable to assess given mental status/medical condition  LABORATORY PANEL:   CBC  Recent Labs Lab 11/14/15 0732  WBC 8.2  HGB 9.4*  HCT 27.7*  PLT 227   ------------------------------------------------------------------------------------------------------------------  Chemistries   Recent Labs Lab 11/11/15 2211  11/13/15 0408  NA 134*  --  142  K 4.6  --  3.7  CL 107  --  120*  CO2 17*  --  19*  GLUCOSE 132*  --  109*  BUN 81*  --  48*  CREATININE 1.51*  < > 1.25*  CALCIUM 8.9  --  8.1*  AST 23  --   --   ALT 14*  --   --   ALKPHOS 68  --   --   BILITOT 0.3  --   --   < > = values in this interval not displayed. ------------------------------------------------------------------------------------------------------------------  Cardiac Enzymes No results for input(s): TROPONINI in the last 168 hours. ------------------------------------------------------------------------------------------------------------------  RADIOLOGY:  No results found.  EKG:   Orders placed or performed during the hospital encounter of 11/11/15  . EKG 12-Lead  . EKG 12-Lead  . ED EKG  . ED EKG    ASSESSMENT AND PLAN:   Shawn Lyons is a 82 y.o. male presenting with Diarrhea and Emesis . Admitted 11/11/2015 : Shawn #: 2 days  1. Sepsis Secondary to UTI Continue IV antibiotics. Wait for final culture and sensitivities.  2. Acute kidney injury over CKD stage III Improving with IV fluids. Monitor input and output. Repeat labs in the morning.  3. GI bleed likely upper due to esophagitis found on CT scan of the  abdomen. Associated acute blood loss anemia Continue PPI. Status post 2 units packed RBC transfusion. GI on board and appreciate help. No plans for EGD. Conservative management on this patient worsens. Follow hemoglobin.  4. CAD Aspirin on hold due to GI bleed.  5. DVT prophylaxis with  SCDs  All the records are reviewed and case discussed with Care Management/Social Workerr.  CODE STATUS: dnr   TOTAL TIME TAKING CARE OF THIS PATIENT: 35 minutes  POSSIBLE D/C IN 2-3DAYS, DEPENDING ON CLINICAL CONDITION.  Hillary Bow R M.D on 11/14/2015 at 11:02 AM  Between 7am to 6pm - Pager - [phone]  After 6pm: House Pager: - [place] [facility]  Office  [phone]  CC: Primary care physician; Kirk Ruths., MD

## 2015-11-14 NOTE — Consult Note (Signed)
Subjective: Patient seen for  Doing well, occasionsl epigastric discomfort, no n/v or abdominal apin.  One bm last night no recorded as bloody or black.   Objective: Vital signs in last 24 hours: Temp:  [97.7 F (36.5 C)-99.3 F (37.4 C)] 98.6 F (37 C) (08/31 1357) Pulse Rate:  [68-81] 73 (08/31 1357) Resp:  [15-20] 17 (08/31 1300) BP: (124-154)/(53-76) 154/63 (08/31 1357) SpO2:  [98 %-100 %] 98 % (08/31 1357) Blood pressure (!) 154/63, pulse 73, temperature 98.6 F (37 C), temperature source Oral, resp. rate 17, height 5\' 5"  (1.651 m), weight 62.5 kg (137 lb 12.6 oz), SpO2 98 %.   Intake/Output from previous day: 08/30 0701 - 08/31 0700 In: 2972 [I.V.:2010; [id number]; IV Piggyback:100] Out: 2450 [Urine:2450]  Intake/Output this shift: Total I/O In: 1100 [I.V.:1100] Out: 1000 [Urine:1000]   General appearance:  36 male no distress Resp:  cta Cardio:  rrr GI:  Soft nondistended, nontender, bowel sounds positive.  Extremities:     Lab Results: Results for orders placed or performed during the hospital encounter of 11/11/15 (from the past 24 hour(s))  CBC with Differential/Platelet     Status: Abnormal   Collection Time: 11/14/15  7:32 AM  Result Value Ref Range   WBC 8.2 3.8 - 10.6 K/uL   RBC 3.14 (L) 4.40 - 5.90 MIL/uL   Hemoglobin 9.4 (L) 13.0 - 18.0 g/dL   HCT 27.7 (L) 40.0 - 52.0 %   MCV 88.4 80.0 - 100.0 fL   MCH 30.0 26.0 - 34.0 pg   MCHC 34.0 32.0 - 36.0 g/dL   RDW 15.0 (H) 11.5 - 14.5 %   Platelets 227 150 - 440 K/uL   Neutrophils Relative % 73% %   Neutro Abs 5.9 1.4 - 6.5 K/uL   Lymphocytes Relative 15% %   Lymphs Abs 1.3 1.0 - 3.6 K/uL   Monocytes Relative 8% %   Monocytes Absolute 0.7 0.2 - 1.0 K/uL   Eosinophils Relative 4% %   Eosinophils Absolute 0.3 0 - 0.7 K/uL   Basophils Relative 0% %   Basophils Absolute 0.0 0 - 0.1 K/uL      Recent Labs  11/12/15 1349 11/12/15 2032 11/13/15 0408 11/14/15 0732  WBC 6.7  --  6.9 8.2  HGB 6.9* 7.6*  6.7* 9.4*  HCT 19.9* 23.0* 19.6* 27.7*  PLT 249  --  240 227   BMET  Recent Labs  11/11/15 2211 11/12/15 1349 11/13/15 0408  NA 134*  --  142  K 4.6  --  3.7  CL 107  --  120*  CO2 17*  --  19*  GLUCOSE 132*  --  109*  BUN 81*  --  48*  CREATININE 1.51* 1.41* 1.25*  CALCIUM 8.9  --  8.1*   LFT  Recent Labs  11/11/15 2211  PROT 6.3*  ALBUMIN 3.3*  AST 23  ALT 14*  ALKPHOS 68  BILITOT 0.3   PT/INR  Recent Labs  11/12/15 1349  LABPROT 15.8*  INR 1.25   Hepatitis Panel No results for input(s): HEPBSAG, HCVAB, HEPAIGM, HEPBIGM in the last 72 hours. C-Diff No results for input(s): CDIFFTOX in the last 72 hours. No results for input(s): CDIFFPCR in the last 72 hours.   Studies/Results: No results found.  Scheduled Inpatient Medications:   . Chlorhexidine Gluconate Cloth  6 each Topical Q0600  . cholecalciferol  1,000 Units Oral Daily  . ciprofloxacin  250 mg Oral BID  . DULoxetine  60 mg Oral  Daily  . mupirocin ointment  1 application Nasal BID  . pantoprazole  40 mg Oral BID AC  . simvastatin  40 mg Oral QHS    Continuous Inpatient Infusions:   . dextrose 5 % and 0.9% NaCl 100 mL/hr at 11/14/15 1501    PRN Inpatient Medications:  acetaminophen **OR** acetaminophen, guaiFENesin-dextromethorphan, ondansetron **OR** ondansetron (ZOFRAN) IV, polyvinyl alcohol, polyvinyl alcohol  Miscellaneous:   Assessment:  1) reported hematemesis, not recurrent.  No evidence of ongoing GI bleeding.   Plan:  1) continue bid ppi, check h pylori stool antigen next available bm.  No plans for sedated luminal evaluation.   Following.   Lollie Sails MD 11/14/2015, 4:23 PM

## 2015-11-14 NOTE — Care Management Note (Signed)
Case Management Note  Patient Details  Name: CALOB HODOR MRN: [id number] Date of Birth: 1919/07/27  Subjective/Objective:                  Spoke with patient's daughter [name] [phone] by phone to update her on patient's plan. Per discussion in rounds this AM patient would be transferred to acute care level of care today. She states that he is from from [facility] however, he was ambulating with a front-wheeled walker at [facility]. He has received PT in the past but not currently per [name]. He is not on O2 at home.    Action/Plan: PT evaluation would be helpful. CSW following.   Expected Discharge Date:                  Expected Discharge Plan:     In-House Referral:  Clinical Social Work  Discharge planning Services  CM Consult  Post Acute Care Choice:    Choice offered to:  Adult Children  DME Arranged:    DME Agency:     HH Arranged:    HH Agency:     Status of Service:  Completed, signed off  If discussed at [place] of Stay Meetings, dates discussed:    Additional Comments:  Marshell Garfinkel, RN 11/14/2015, 11:01 AM

## 2015-11-15 LAB — CBC WITH DIFFERENTIAL/PLATELET
BASOS PCT: 0 %
Basophils Absolute: 0 10*3/uL (ref 0–0.1)
EOS ABS: 0.2 10*3/uL (ref 0–0.7)
EOS PCT: 3 %
HCT: 26.5 % — ABNORMAL LOW (ref 40.0–52.0)
Hemoglobin: 9.3 g/dL — ABNORMAL LOW (ref 13.0–18.0)
Lymphocytes Relative: 16 %
Lymphs Abs: 1.1 10*3/uL (ref 1.0–3.6)
MCH: 30.3 pg (ref 26.0–34.0)
MCHC: 35.2 g/dL (ref 32.0–36.0)
MCV: 86.1 fL (ref 80.0–100.0)
MONO ABS: 0.6 10*3/uL (ref 0.2–1.0)
MONOS PCT: 8 %
Neutro Abs: 5 10*3/uL (ref 1.4–6.5)
Neutrophils Relative %: 73 %
Platelets: 224 10*3/uL (ref 150–440)
RBC: 3.08 MIL/uL — ABNORMAL LOW (ref 4.40–5.90)
RDW: 14.7 % — AB (ref 11.5–14.5)
WBC: 6.9 10*3/uL (ref 3.8–10.6)

## 2015-11-15 LAB — BASIC METABOLIC PANEL
Anion gap: 5 (ref 5–15)
BUN: 17 mg/dL (ref 6–20)
CALCIUM: 8.3 mg/dL — AB (ref 8.9–10.3)
CO2: 20 mmol/L — ABNORMAL LOW (ref 22–32)
CREATININE: 0.85 mg/dL (ref 0.61–1.24)
Chloride: 116 mmol/L — ABNORMAL HIGH (ref 101–111)
GFR calc non Af Amer: >60 mL/min (ref >60)
Glucose, Bld: 102 mg/dL — ABNORMAL HIGH (ref 65–99)
Potassium: 3.1 mmol/L — ABNORMAL LOW (ref 3.5–5.1)
SODIUM: 141 mmol/L (ref 135–145)

## 2015-11-15 MED ORDER — LANSOPRAZOLE 30 MG PO CPDR: 30.0000 mg | DELAYED_RELEASE_CAPSULE | Freq: Two times a day (BID) | ORAL | 0 refills | Status: AC

## 2015-11-15 MED ORDER — FERROUS SULFATE 325 (65 FE) MG PO TABS: 325.0000 mg | ORAL_TABLET | Freq: Every day | ORAL | 0 refills | Status: AC

## 2015-11-15 MED ORDER — CIPROFLOXACIN HCL 250 MG PO TABS: 250.0000 mg | ORAL_TABLET | Freq: Two times a day (BID) | ORAL | 0 refills | Status: AC

## 2015-11-15 NOTE — Progress Notes (Signed)
Report given to Paulina at [facility]. VS stable. No signs of acute distress. No other issues noted  at this time.

## 2015-11-15 NOTE — Care Management Important Message (Signed)
Important Message  Patient Details  Name: MENA MICHELOTTI MRN: [id number] Date of Birth: 08/19/1919   Medicare Important Message Given:  Yes    Beverly Sessions, RN 11/15/2015, 12:52 PM

## 2015-11-15 NOTE — Discharge Summary (Signed)
Shawn Lyons at Shawn Lyons: Shawn Lyons    MR#:  [id number]  DATE OF BIRTH:  06-03-1919  DATE OF ADMISSION:  11/11/2015 ADMITTING PHYSICIAN: Harrie Foreman, MD  DATE OF DISCHARGE: No discharge date for patient encounter.  PRIMARY CARE PHYSICIAN: Kirk Ruths., MD   ADMISSION DIAGNOSIS:  Other specified hypotension [I95.89] Melena [K92.1] Gastrointestinal hemorrhage with melena [K92.1]  DISCHARGE DIAGNOSIS:  Active Problems:   GI bleed   SECONDARY DIAGNOSIS:   Past Medical History:  Diagnosis Date  . Arthritis   . Cancer [facility])    h/o bladder cancer  . Coronary artery disease   . Depression   . Vitamin B12 deficiency      ADMITTING HISTORY  Chief Complaint: Hypotension HPI: The patient with past medical history of coronary artery disease and bladder cancer status post ileal ureterostomy placement presents emergency department from the nursing home due to concerns for hypotension. Systolic blood pressure was reportedly under 100 but has consistently been above 110 here at the hospital. Also noted is melanotic stools and coffee-ground emesis. The patient's daughter states that he has had sticky dark vomit per nursing home staff. The patient has dementia but has also complained of some abdominal pain over the last several weeks. The nursing home staff also reported diarrhea but he has had no loose stools since arrival. In the emergency department he was found to be mildly tachycardic but in no acute distress. Laboratory evaluation was significant for some mild acute kidney injury. After intravenous PPI and fluid resuscitation emergency department staff called the hospitalist service for further management.  HOSPITAL COURSE:   Shawn Lyons is a 82 y.o. male presenting with Diarrhea and Emesis Admitted 11/11/2015  1. Sepsis Secondary to UTI- Resolved On IV ceftriaxone in hospital. Change to PO ciprofloxacin at  discharge for 4 more days Cx had multiple species  2. Acute kidney injury over CKD stage III Resolved with IV fluids  3. GI bleed likely upper due to esophagitis found on CT scan of the abdomen. Associated acute blood loss anemia  Status post 2 units packed RBC transfusion. Hemoglobin stable at this time at 9.3. Dr. Jaquita Rector has seen the patient. No plans for EGD. Continue PPI twice a day after discharge. Added iron supplementation.  4. CAD Patient was on aspirin. This needs to be held for 1 week and can be restarted if no further bleeding.  Stable for discharge back to [facility] with home health physical therapy.  CONSULTS OBTAINED:    DRUG ALLERGIES:   Allergies  Allergen Reactions  . Augmentin [Amoxicillin-Pot Clavulanate] Nausea Only  . Prilosec [Omeprazole] Diarrhea    DISCHARGE MEDICATIONS:   Current Discharge Medication List    START taking these medications   Details  ciprofloxacin (CIPRO) 250 MG tablet Take 1 tablet (250 mg total) by mouth 2 (two) times daily. Qty: 8 tablet, Refills: 0    ferrous sulfate 325 (65 FE) MG tablet Take 1 tablet (325 mg total) by mouth daily with breakfast. Qty: 30 tablet, Refills: 0      CONTINUE these medications which have CHANGED   Details  lansoprazole (PREVACID) 30 MG capsule Take 1 capsule (30 mg total) by mouth 2 (two) times daily before a meal. Qty: 60 capsule, Refills: 0      CONTINUE these medications which have NOT CHANGED   Details  acetaminophen (TYLENOL) 500 MG tablet Take 1,000 mg by mouth every 4 (four) hours as needed for  moderate pain (max of 4025m per 24  hours).    acetaminophen (TYLENOL) 650 MG CR tablet Take 650 mg by mouth every 8 (eight) hours.    albuterol (PROVENTIL HFA;VENTOLIN HFA) 108 (90 BASE) MCG/ACT inhaler Inhale 1 puff into the lungs every 4 (four) hours as needed for wheezing.    albuterol (PROVENTIL) (2.5 MG/3ML) 0.083% nebulizer solution Take 2.5 mg by nebulization 3 (three) times  daily as needed for wheezing or shortness of breath.    aspirin EC 81 MG tablet Take 81 mg by mouth at bedtime.     cholecalciferol (VITAMIN D) 1000 UNITS tablet Take 1,000 Units by mouth daily.    Cyanocobalamin 1000 MCG/ML KIT Inject 1 mL as directed every 30 (thirty) days. Around the 28th day of the month    DULoxetine (CYMBALTA) 60 MG capsule Take 60 mg by mouth daily.    furosemide (LASIX) 20 MG tablet Take 20 mg by mouth daily as needed for edema.     guaiFENesin-dextromethorphan (ROBITUSSIN DM) 100-10 MG/5ML syrup Take 5 mLs by mouth every 4 (four) hours as needed for cough.    hydroxypropyl methylcellulose / hypromellose (ISOPTO TEARS / GONIOVISC) 2.5 % ophthalmic solution Place 1 drop into both eyes every 2 (two) hours as needed for dry eyes.    mirtazapine (REMERON) 30 MG tablet Take 30 mg by mouth at bedtime.     nitroGLYCERIN (NITROSTAT) 0.4 MG SL tablet Place 0.4 mg under the tongue every 5 (five) minutes as needed for chest pain.    polyethylene glycol (MIRALAX / GLYCOLAX) packet Take 17 g by mouth daily.     simvastatin (ZOCOR) 40 MG tablet Take 40 mg by mouth at bedtime.      STOP taking these medications     Soft Lens Products (SENSITIVE EYES SALINE) SOLN         Today   VITAL SIGNS:  Blood pressure 137/60, pulse 81, temperature 98.6 F (37 C), temperature source Oral, resp. rate 18, height 5' 5"  (1.651 m), weight 63.5 kg (140 lb), SpO2 97 %.  I/O:   Intake/Output Summary (Last 24 hours) at 11/15/15 1228 Last data filed at 11/15/15 0848  Gross per 24 hour  Intake             1005 ml  Output              650 ml  Net              355 ml    PHYSICAL EXAMINATION:  Physical Exam  GENERAL:  82y.o.-year-old patient lying in the bed with no acute distress.  LUNGS: Normal breath sounds bilaterally, no wheezing, rales,rhonchi or crepitation.  CARDIOVASCULAR: S1, S2 normal. No murmurs, rubs, or gallops.  ABDOMEN: Soft, non-tender, non-distended. Bowel  sounds present. No organomegaly or mass.  PSYCHIATRIC: The patient is alert and awake. Pleasantly confused  DATA REVIEW:   CBC  Recent Labs Lab 11/15/15 0421  WBC 6.9  HGB 9.3*  HCT 26.5*  PLT 224    Chemistries   Recent Labs Lab 11/11/15 2211  11/15/15 0421  NA 134*  < > 141  K 4.6  < > 3.1*  CL 107  < > 116*  CO2 17*  < > 20*  GLUCOSE 132*  < > 102*  BUN 81*  < > 17  CREATININE 1.51*  < > 0.85  CALCIUM 8.9  < > 8.3*  AST 23  --   --   ALT 14*  --   --  ALKPHOS 68  --   --   BILITOT 0.3  --   --   < > = values in this interval not displayed.  Cardiac Enzymes No results for input(s): TROPONINI in the last 168 hours.  Microbiology Results  Results for orders placed or performed during the hospital encounter of 11/11/15  Urine culture     Status: Abnormal   Collection Time: 11/11/15 10:23 PM  Result Value Ref Range Status   Specimen Description URINE, RANDOM  Final   Special Requests NONE  Final   Culture MULTIPLE SPECIES PRESENT, SUGGEST RECOLLECTION (A)  Final   Report Status 11/13/2015 FINAL  Final  MRSA PCR Screening     Status: Abnormal   Collection Time: 11/12/15  3:59 AM  Result Value Ref Range Status   MRSA by PCR POSITIVE (A) NEGATIVE Final    Comment:        The GeneXpert MRSA Assay (FDA approved for NASAL specimens only), is one component of a comprehensive MRSA colonization surveillance program. It is not intended to diagnose MRSA infection nor to guide or monitor treatment for MRSA infections. CRITICAL RESULT CALLED TO, READ BACK BY AND VERIFIED WITH: LESLIE NELSON@[email or web address]  ON 11/12/15 BY HKP   CULTURE, BLOOD (ROUTINE X 2) w Reflex to ID Panel     Status: None (Preliminary result)   Collection Time: 11/12/15  7:54 AM  Result Value Ref Range Status   Specimen Description BLOOD LEFT AC  Final   Special Requests BOTTLES DRAWN AEROBIC AND ANAEROBIC 6ML  Final   Culture NO GROWTH 3 DAYS  Final   Report Status PENDING  Incomplete  CULTURE,  BLOOD (ROUTINE X 2) w Reflex to ID Panel     Status: None (Preliminary result)   Collection Time: 11/12/15  8:02 AM  Result Value Ref Range Status   Specimen Description BLOOD LEFT HAND  Final   Special Requests   Final    BOTTLES DRAWN AEROBIC AND ANAEROBIC ANA 6ML AER 5ML   Culture NO GROWTH 3 DAYS  Final   Report Status PENDING  Incomplete    RADIOLOGY:  No results found.  Follow up with PCP in 1 week.  Management plans discussed with the patient, family and they are in agreement.  CODE STATUS:     Code Status Orders        Start     Ordered   11/12/15 0352  Do not attempt resuscitation (DNR)  Continuous    Question Answer Comment  In the event of cardiac or respiratory ARREST Do not call a "code blue"   In the event of cardiac or respiratory ARREST Do not perform Intubation, CPR, defibrillation or ACLS   In the event of cardiac or respiratory ARREST Use medication by any route, position, wound care, and other measures to relive pain and suffering. May use oxygen, suction and manual treatment of airway obstruction as needed for comfort.      11/12/15 0351    Code Status History    Date Active Date Inactive Code Status Order ID Comments User Context   12/08/2014  3:11 AM 12/11/2014  3:58 PM DNR [id number]  Lytle Butte, MD ED    Advance Directive Documentation   Flowsheet Row Most Recent Value  Type of Advance Directive  Healthcare Power of Attorney, Living will  Pre-existing out of facility DNR order (yellow form or pink MOST form)  No data  "MOST" Form in Place?  No data  TOTAL TIME TAKING CARE OF THIS PATIENT ON DAY OF DISCHARGE: more than 30 minutes.   Hillary Bow R M.D on 11/15/2015 at 12:28 PM  Between 7am to 6pm - Pager - [phone]  After 6pm go to [email or web address] - password [email or web address] [place] [facility]  Office  [phone]  CC: Primary care physician; Kirk Ruths., MD  Note: This dictation was prepared with Dragon dictation  along with smaller phrase technology. Any transcriptional errors that result from this process are unintentional.

## 2015-11-15 NOTE — Clinical Social Work Note (Signed)
Patient to be d/c'ed today to [facility] where patient is a long term care resident.  Patient and family agreeable to plans will transport via ems RN to call report to [phone].  MSW updated patient's daughter Shana Chute [phone].  Evette Cristal, MSW Mon-Fri 8a-4:30p [phone]

## 2015-11-15 NOTE — Evaluation (Signed)
Physical Therapy Evaluation Patient Details Name: Shawn Lyons MRN: [id number] DOB: Sep 14, 1919 Today's Date: 11/15/2015   History of Present Illness  Pt admitted for GI bleed. Pt with history of CAD, bladder cancer and is s/p ileal ureterostomy placement, and dementia. Pt with history of hypotension. Hospital stay complicated by becoming hypotensive and transferring to CCU as well as need GI bleed and is now s/p 2 blood transfusions.  Clinical Impression  Pt is a pleasant 82 year old male who was admitted for GI bleed. Pt performs bed mobility, transfers, and ambulation with cga and rw. Pt demonstrates deficits with strength/mobility/endurance. Would benefit from skilled PT to address above deficits and promote optimal return to PLOF. Recommend transition to [place] upon discharge from acute hospitalization.       Follow Up Recommendations  (return back to [facility] with HHPT)    Equipment Recommendations       Recommendations for Other Services       Precautions / Restrictions Precautions Precautions: Fall Restrictions Weight Bearing Restrictions: No      Mobility  Bed Mobility Overal bed mobility: Needs Assistance Bed Mobility: Supine to Sit     Supine to sit: Min guard     General bed mobility comments: bed mobility performed with safe technique. Pt needs cues for sequencing and to scoot out towards EOB. Once seated, able to sit with supervision  Transfers Overall transfer level: Needs assistance Equipment used: Rolling walker (2 wheeled) Transfers: Sit to/from Stand Sit to Stand: Min guard         General transfer comment: safe technique performed with kyphotic posture and severe scoliosis noted.   Ambulation/Gait Ambulation/Gait assistance: Min guard Ambulation Distance (Feet): 50 Feet Assistive device: Rolling walker (2 wheeled) Gait Pattern/deviations: Step-through pattern     General Gait Details: ambualted using reciprocal gait pattern and  forward flexed posture. Pt able to safely navigate room and perform obstacle avoidance. 1 cue required to keep RW closer to body. Pt with slight SOB symptoms with ambulation however O2 sats remain WNL on room air.  Stairs            Wheelchair Mobility    Modified Rankin (Stroke Patients Only)       Balance Overall balance assessment: Needs assistance Sitting-balance support: Feet supported Sitting balance-Leahy Scale: Good     Standing balance support: Bilateral upper extremity supported Standing balance-Leahy Scale: Good                               Pertinent Vitals/Pain Pain Assessment: No/denies pain    Home Living Family/patient expects to be discharged to::  (from LTC at [facility])                 Additional Comments: from LTC at [facility]    Prior Function Level of Independence: Needs assistance         Comments: supervision for ambulation using RW     Hand Dominance        Extremity/Trunk Assessment   Upper Extremity Assessment: Generalized weakness (B UE grossly 4/5)           Lower Extremity Assessment: Generalized weakness (B LE grossly 4/5)         Communication   Communication: No difficulties  Cognition Arousal/Alertness: Awake/alert Behavior During Therapy: WFL for tasks assessed/performed Overall Cognitive Status: Within Functional Limits for tasks assessed  General Comments      Exercises Other Exercises Other Exercises: Seated ther-ex performed on B LE including SAQ, hip abd/add, SLR, and ankle pumps. All ther-ex performed x 10 reps with cga.      Assessment/Plan    PT Assessment Patient needs continued PT services  PT Diagnosis Difficulty walking;Generalized weakness   PT Problem List Decreased strength;Decreased balance;Decreased mobility  PT Treatment Interventions Gait training;Therapeutic exercise   PT Goals (Current goals can be found in the Care Plan section)  Acute Rehab PT Goals Patient Stated Goal: to go to [facility] PT Goal Formulation: With patient Time For Goal Achievement: 11/29/15 Potential to Achieve Goals: Good    Frequency Min 2X/week   Barriers to discharge        Co-evaluation               End of Session Equipment Utilized During Treatment: Gait belt Activity Tolerance: Patient tolerated treatment well Patient left: in chair;with chair alarm set Nurse Communication: Mobility status         Time: [id number] PT Time Calculation (min) (ACUTE ONLY): 22 min   Charges:   PT Evaluation $PT Eval Moderate Complexity: 1 Procedure PT Treatments $Therapeutic Exercise: 8-22 mins   PT G Codes:        Nayda Riesen 11/28/15, 11:59 AM  Greggory Stallion, PT, DPT [phone]

## 2015-11-17 LAB — CULTURE, BLOOD (ROUTINE X 2)
Culture: NO GROWTH
Culture: NO GROWTH

## 2015-11-19 DIAGNOSIS — L03312 Cellulitis of back [any part except buttock]: Secondary | ICD-10-CM

## 2015-11-19 DIAGNOSIS — N390 Urinary tract infection, site not specified: Secondary | ICD-10-CM

## 2015-11-19 DIAGNOSIS — K922 Gastrointestinal hemorrhage, unspecified: Secondary | ICD-10-CM | POA: Diagnosis not present

## 2015-11-25 DIAGNOSIS — L03312 Cellulitis of back [any part except buttock]: Secondary | ICD-10-CM

## 2015-12-09 DIAGNOSIS — R451 Restlessness and agitation: Secondary | ICD-10-CM | POA: Diagnosis not present

## 2015-12-09 DIAGNOSIS — F0150 Vascular dementia without behavioral disturbance: Secondary | ICD-10-CM | POA: Diagnosis not present

## 2016-01-01 DIAGNOSIS — F323 Major depressive disorder, single episode, severe with psychotic features: Secondary | ICD-10-CM

## 2016-01-01 DIAGNOSIS — Z936 Other artificial openings of urinary tract status: Secondary | ICD-10-CM | POA: Diagnosis not present

## 2016-01-01 DIAGNOSIS — K219 Gastro-esophageal reflux disease without esophagitis: Secondary | ICD-10-CM

## 2016-01-01 DIAGNOSIS — I2510 Atherosclerotic heart disease of native coronary artery without angina pectoris: Secondary | ICD-10-CM | POA: Diagnosis not present

## 2016-01-01 DIAGNOSIS — F0150 Vascular dementia without behavioral disturbance: Secondary | ICD-10-CM | POA: Diagnosis not present

## 2016-01-08 DIAGNOSIS — L02212 Cutaneous abscess of back [any part, except buttock]: Secondary | ICD-10-CM | POA: Diagnosis not present

## 2016-03-05 DIAGNOSIS — F0150 Vascular dementia without behavioral disturbance: Secondary | ICD-10-CM

## 2016-03-05 DIAGNOSIS — F332 Major depressive disorder, recurrent severe without psychotic features: Secondary | ICD-10-CM | POA: Diagnosis not present

## 2016-03-05 DIAGNOSIS — I2510 Atherosclerotic heart disease of native coronary artery without angina pectoris: Secondary | ICD-10-CM

## 2016-03-05 DIAGNOSIS — Z936 Other artificial openings of urinary tract status: Secondary | ICD-10-CM | POA: Diagnosis not present

## 2016-03-05 DIAGNOSIS — K219 Gastro-esophageal reflux disease without esophagitis: Secondary | ICD-10-CM | POA: Diagnosis not present

## 2016-03-30 DIAGNOSIS — J438 Other emphysema: Secondary | ICD-10-CM | POA: Diagnosis not present

## 2016-05-08 DIAGNOSIS — I2510 Atherosclerotic heart disease of native coronary artery without angina pectoris: Secondary | ICD-10-CM

## 2016-05-08 DIAGNOSIS — J441 Chronic obstructive pulmonary disease with (acute) exacerbation: Secondary | ICD-10-CM | POA: Diagnosis not present

## 2016-05-08 DIAGNOSIS — K219 Gastro-esophageal reflux disease without esophagitis: Secondary | ICD-10-CM

## 2016-05-08 DIAGNOSIS — Z936 Other artificial openings of urinary tract status: Secondary | ICD-10-CM | POA: Diagnosis not present

## 2016-05-08 DIAGNOSIS — F0150 Vascular dementia without behavioral disturbance: Secondary | ICD-10-CM | POA: Diagnosis not present

## 2016-05-08 DIAGNOSIS — F323 Major depressive disorder, single episode, severe with psychotic features: Secondary | ICD-10-CM | POA: Diagnosis not present

## 2016-05-14 DIAGNOSIS — L723 Sebaceous cyst: Secondary | ICD-10-CM | POA: Diagnosis not present

## 2016-05-21 DIAGNOSIS — L309 Dermatitis, unspecified: Secondary | ICD-10-CM | POA: Diagnosis not present

## 2016-05-25 DIAGNOSIS — J209 Acute bronchitis, unspecified: Secondary | ICD-10-CM

## 2016-07-08 DIAGNOSIS — F323 Major depressive disorder, single episode, severe with psychotic features: Secondary | ICD-10-CM | POA: Diagnosis not present

## 2016-07-08 DIAGNOSIS — H1032 Unspecified acute conjunctivitis, left eye: Secondary | ICD-10-CM

## 2016-07-08 DIAGNOSIS — F0150 Vascular dementia without behavioral disturbance: Secondary | ICD-10-CM | POA: Diagnosis not present

## 2016-07-08 DIAGNOSIS — T7840XA Allergy, unspecified, initial encounter: Secondary | ICD-10-CM | POA: Diagnosis not present

## 2016-07-08 DIAGNOSIS — K219 Gastro-esophageal reflux disease without esophagitis: Secondary | ICD-10-CM

## 2016-07-08 DIAGNOSIS — L0201 Cutaneous abscess of face: Secondary | ICD-10-CM | POA: Diagnosis not present

## 2016-07-13 DIAGNOSIS — B029 Zoster without complications: Secondary | ICD-10-CM | POA: Diagnosis not present

## 2016-08-08 ENCOUNTER — Telehealth: Payer: Self-pay | Admitting: Family Medicine

## 2016-08-08 NOTE — Telephone Encounter (Signed)
Caregiver at the SNF is calling to report accidental fall. Mr Massar fell around 1:00 pm, she thinks he did slide on water. He has no apparent significant injury just a "small purple" area on outer aspect of right elbow. He has normal ROM and has no pain. No head trauma. Plan: Will monitor for MS changes.  Rukia Mcgillivray [place], MD

## 2016-08-11 NOTE — Telephone Encounter (Signed)
PLEASE NOTE: All timestamps contained within this report are represented as [place] Standard Time. CONFIDENTIALTY NOTICE: This fax transmission is intended only for the addressee. It contains information that is legally privileged, confidential or otherwise protected from use or disclosure. If you are not the intended recipient, you are strictly prohibited from reviewing, disclosing, copying using or disseminating any of this information or taking any action in reliance on or regarding this information. If you have received this fax in error, please notify us immediately by telephone so that we can arrange for its return to [place]. Phone: [phone], Toll-Free: [phone], Fax: [phone] Page: 1 of 1 Call Id: [id number] [place] Night - Client Nonclinical Telephone Record [place] Night - Client Client Site [place] Physician Viviana Simpler - MD Contact Type Call Call [place] Page Now Who Is [place] / [place] Name [place] Name [facility] [place] Number [phone] Patient Name Shawn Lyons Patient DOB [birth year] Reason for Call Report a patient fall Initial Comment Caller is calling to report a fall. Found patient sitting on the floor, said he slipped in water Paging DoctorName Phone DateTime Result/Outcome Message Type Notes [place], Betty - MD [phone] 08/08/2016 1:59:21 PM Called On Call Provider - Reached Doctor Paged [place], Betty - MD 08/08/2016 1:59:26 PM Spoke with On Call - General Message Result Call Closed By: Philis Kendall Transaction Date/Time: 08/08/2016 1:34:02 PM (ET)

## 2016-08-11 NOTE — Telephone Encounter (Signed)
Reviewed at [facility] today He is doing fine

## 2016-09-03 DIAGNOSIS — I2510 Atherosclerotic heart disease of native coronary artery without angina pectoris: Secondary | ICD-10-CM | POA: Diagnosis not present

## 2016-09-03 DIAGNOSIS — F0150 Vascular dementia without behavioral disturbance: Secondary | ICD-10-CM | POA: Diagnosis not present

## 2016-09-03 DIAGNOSIS — Z936 Other artificial openings of urinary tract status: Secondary | ICD-10-CM

## 2016-09-03 DIAGNOSIS — K219 Gastro-esophageal reflux disease without esophagitis: Secondary | ICD-10-CM | POA: Diagnosis not present

## 2016-09-03 DIAGNOSIS — F331 Major depressive disorder, recurrent, moderate: Secondary | ICD-10-CM | POA: Diagnosis not present

## 2016-09-21 ENCOUNTER — Telehealth: Payer: Self-pay

## 2016-09-21 DIAGNOSIS — L02411 Cutaneous abscess of right axilla: Secondary | ICD-10-CM

## 2016-09-21 DIAGNOSIS — L723 Sebaceous cyst: Secondary | ICD-10-CM

## 2016-09-21 NOTE — Telephone Encounter (Signed)
Seen today On doxy and warm compresses for infected cyst

## 2016-09-21 NOTE — Telephone Encounter (Signed)
PLEASE NOTE: All timestamps contained within this report are represented as [place] Standard Time. CONFIDENTIALTY NOTICE: This fax transmission is intended only for the addressee. It contains information that is legally privileged, confidential or otherwise protected from use or disclosure. If you are not the intended recipient, you are strictly prohibited from reviewing, disclosing, copying using or disseminating any of this information or taking any action in reliance on or regarding this information. If you have received this fax in error, please notify us immediately by telephone so that we can arrange for its return to [place]. Phone: [phone], Toll-Free: [phone], Fax: [phone] Page: 1 of 1 Call Id: [id number] [place] Patient Name: Shawn Lyons Gender: Male DOB: November 10, 1919 Age: 83 Y 1 M 15 D Return Phone Number: City/State/Zip: [place] Client [place] Night - Client Client Site [place] Physician Viviana Simpler - MD Who Is Calling Patient / Member / Family / Caregiver Call Type Triage / Clinical Caller Name Juliann Pulse Relationship To Patient Care Giver Return Phone Number Please choose phone number Chief Complaint Skin Lesion - Moles/ Lumps/ Growths Reason for Call Symptomatic / Request for [place] states she is with [facility] [phone] and a patient has a lump on his that is red and painful. Nurse Assessment Nurse: Ouida Sills, RN, Sharyn Lull Date/Time (Eastern Time): 09/19/2016 2:18:15 AM Confirm and document reason for call. If symptomatic, describe symptoms. ---Juliann Pulse, RN is with [facility] [phone] and a patient has a lump on his that is red and painful. Does the PT have any chronic conditions? (i.e. diabetes, asthma, etc.) ---Yes List chronic conditions.  ---cellulitis, boils Guidelines Guideline Title Affirmed Question Disp. Time Eilene Ghazi Time) Disposition Final User 09/19/2016 2:22:32 AM Clinical Call Yes Ouida Sills, RN, Sharyn Lull Referrals REFERRED TO PCP OFFICE Paging DoctorName Phone DateTime Action Result/Outcome Notes Renford Dills - MD [id number] 09/19/2016 2:21:58 AM Doctor Paged Called On Call Provider - Sherryl Manges - MD 09/19/2016 2:22:19 AM Message Result Spoke with On Call - General Oncall warm transferred to [facility], RN per request to discuss pts sx.

## 2016-09-30 DIAGNOSIS — K625 Hemorrhage of anus and rectum: Secondary | ICD-10-CM | POA: Diagnosis not present

## 2016-10-06 DIAGNOSIS — L02213 Cutaneous abscess of chest wall: Secondary | ICD-10-CM | POA: Diagnosis not present

## 2016-11-04 DIAGNOSIS — I2510 Atherosclerotic heart disease of native coronary artery without angina pectoris: Secondary | ICD-10-CM | POA: Diagnosis not present

## 2016-11-04 DIAGNOSIS — Z936 Other artificial openings of urinary tract status: Secondary | ICD-10-CM | POA: Diagnosis not present

## 2016-11-04 DIAGNOSIS — F0150 Vascular dementia without behavioral disturbance: Secondary | ICD-10-CM

## 2016-11-04 DIAGNOSIS — F339 Major depressive disorder, recurrent, unspecified: Secondary | ICD-10-CM

## 2016-11-04 DIAGNOSIS — K219 Gastro-esophageal reflux disease without esophagitis: Secondary | ICD-10-CM

## 2016-11-30 DIAGNOSIS — R05 Cough: Secondary | ICD-10-CM

## 2016-12-24 DIAGNOSIS — F0150 Vascular dementia without behavioral disturbance: Secondary | ICD-10-CM | POA: Diagnosis not present

## 2016-12-24 DIAGNOSIS — Z936 Other artificial openings of urinary tract status: Secondary | ICD-10-CM | POA: Diagnosis not present

## 2016-12-24 DIAGNOSIS — I2510 Atherosclerotic heart disease of native coronary artery without angina pectoris: Secondary | ICD-10-CM

## 2016-12-24 DIAGNOSIS — K219 Gastro-esophageal reflux disease without esophagitis: Secondary | ICD-10-CM | POA: Diagnosis not present

## 2016-12-24 DIAGNOSIS — I129 Hypertensive chronic kidney disease with stage 1 through stage 4 chronic kidney disease, or unspecified chronic kidney disease: Secondary | ICD-10-CM

## 2017-01-29 DIAGNOSIS — L89312 Pressure ulcer of right buttock, stage 2: Secondary | ICD-10-CM | POA: Diagnosis not present

## 2017-02-12 DIAGNOSIS — F339 Major depressive disorder, recurrent, unspecified: Secondary | ICD-10-CM | POA: Diagnosis not present

## 2017-02-12 DIAGNOSIS — M542 Cervicalgia: Secondary | ICD-10-CM | POA: Diagnosis not present

## 2017-03-05 DIAGNOSIS — K219 Gastro-esophageal reflux disease without esophagitis: Secondary | ICD-10-CM | POA: Diagnosis not present

## 2017-03-05 DIAGNOSIS — F430 Acute stress reaction: Secondary | ICD-10-CM | POA: Diagnosis not present

## 2017-03-05 DIAGNOSIS — F0150 Vascular dementia without behavioral disturbance: Secondary | ICD-10-CM | POA: Diagnosis not present

## 2017-03-05 DIAGNOSIS — Z936 Other artificial openings of urinary tract status: Secondary | ICD-10-CM

## 2017-03-05 DIAGNOSIS — I2510 Atherosclerotic heart disease of native coronary artery without angina pectoris: Secondary | ICD-10-CM | POA: Diagnosis not present

## 2017-03-05 DIAGNOSIS — M542 Cervicalgia: Secondary | ICD-10-CM | POA: Diagnosis not present

## 2017-03-24 DIAGNOSIS — B351 Tinea unguium: Secondary | ICD-10-CM

## 2017-05-13 DIAGNOSIS — I50810 Right heart failure, unspecified: Secondary | ICD-10-CM | POA: Diagnosis not present

## 2017-05-13 DIAGNOSIS — F0150 Vascular dementia without behavioral disturbance: Secondary | ICD-10-CM | POA: Diagnosis not present

## 2017-05-13 DIAGNOSIS — K219 Gastro-esophageal reflux disease without esophagitis: Secondary | ICD-10-CM

## 2017-05-13 DIAGNOSIS — Z936 Other artificial openings of urinary tract status: Secondary | ICD-10-CM | POA: Diagnosis not present

## 2017-05-13 DIAGNOSIS — F39 Unspecified mood [affective] disorder: Secondary | ICD-10-CM | POA: Diagnosis not present

## 2017-05-28 DIAGNOSIS — J209 Acute bronchitis, unspecified: Secondary | ICD-10-CM | POA: Diagnosis not present

## 2017-07-14 DEATH — deceased

## 2018-06-18 IMAGING — CT CT ABD-PELV W/ CM
2 of 5 series · 16 of 46 positions shown, 18 images · IV contrast (iopamidol)
Comparison: CT abdomen 09/06/2011

CLINICAL DATA: Emesis and abnormally colored stools.

EXAM:
CT ABDOMEN AND PELVIS WITH CONTRAST
TECHNIQUE: Multidetector CT imaging of the abdomen and pelvis was performed
using the standard protocol following bolus administration of
intravenous contrast.
CONTRAST:  75mL [id number] IOPAMIDOL ([id number]) INJECTION 61%

[Series 2: axial st · axial · 0.84mm/px · z∈[-416,-41]mm · 13 of 85 slices shown, 15 images]
[im 5/85  soft-tissue]
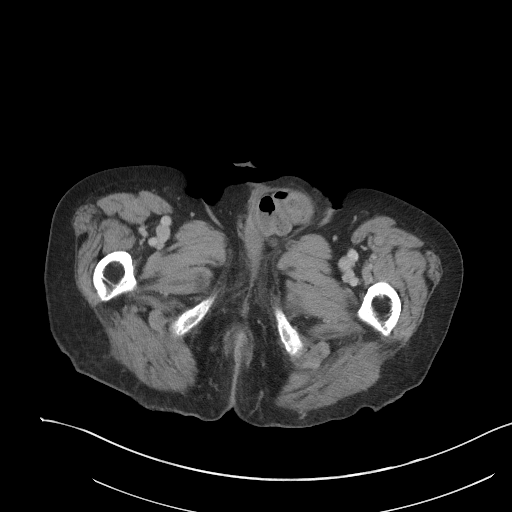
[im 5/85  bone]
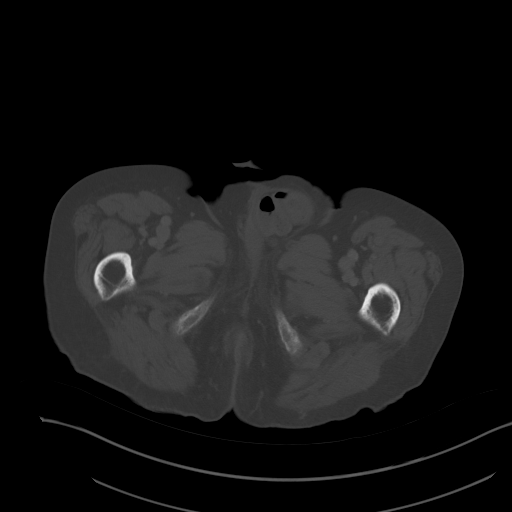
[im 10/85  soft-tissue]
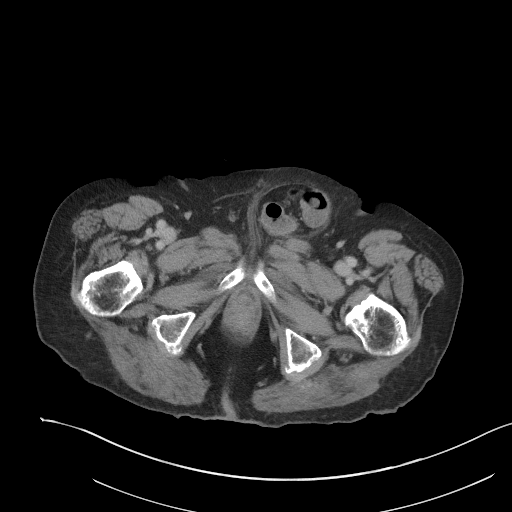
[im 19/85  soft-tissue]
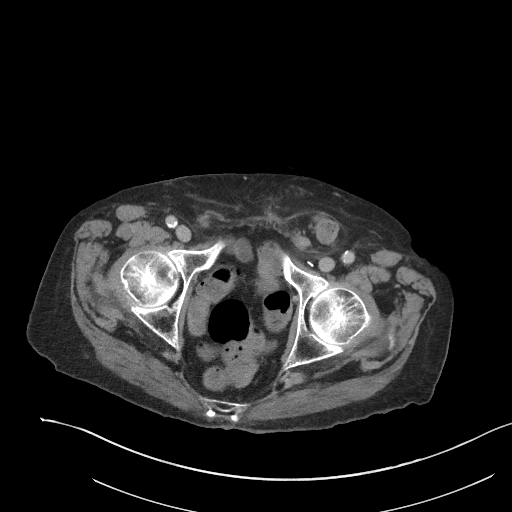
[im 24/85  soft-tissue]
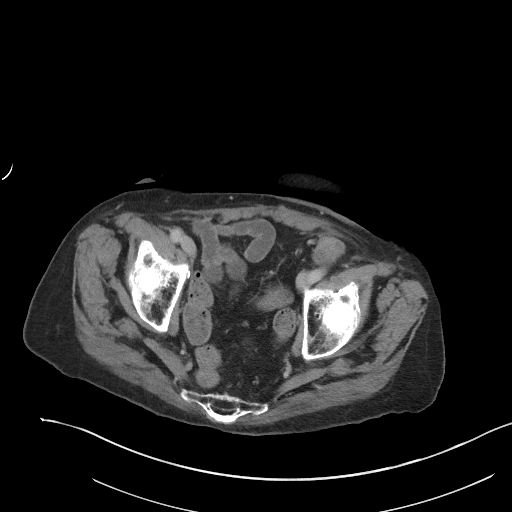
[im 29/85  soft-tissue]
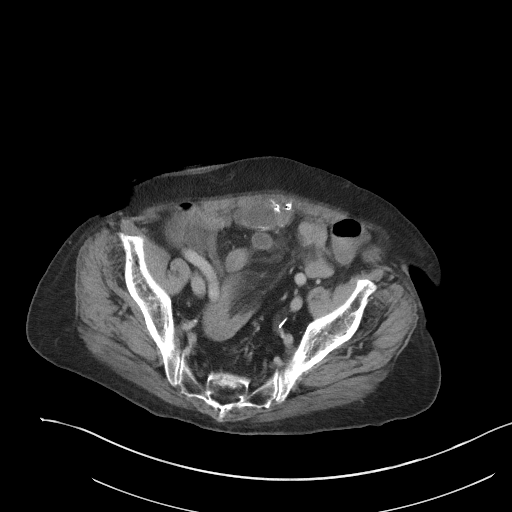
[im 38/85  soft-tissue]
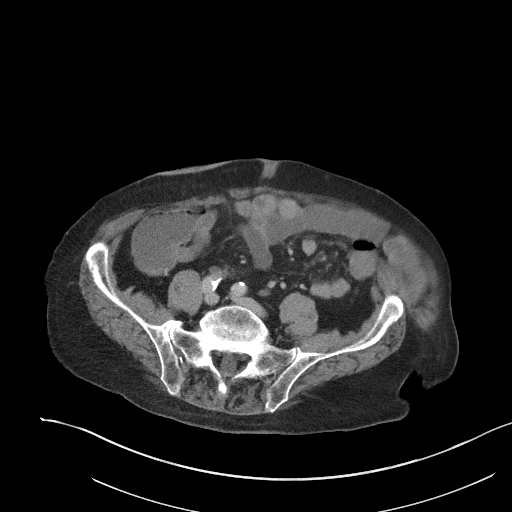
[im 43/85  soft-tissue]
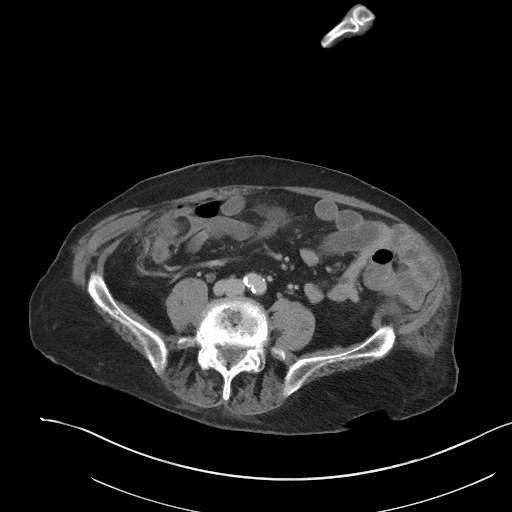
[im 47/85  soft-tissue]
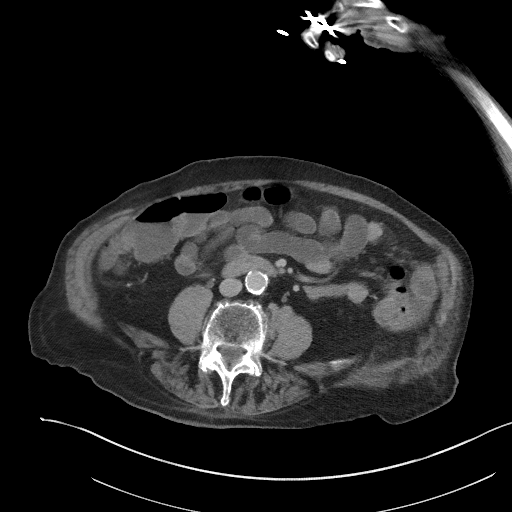
[im 57/85  soft-tissue]
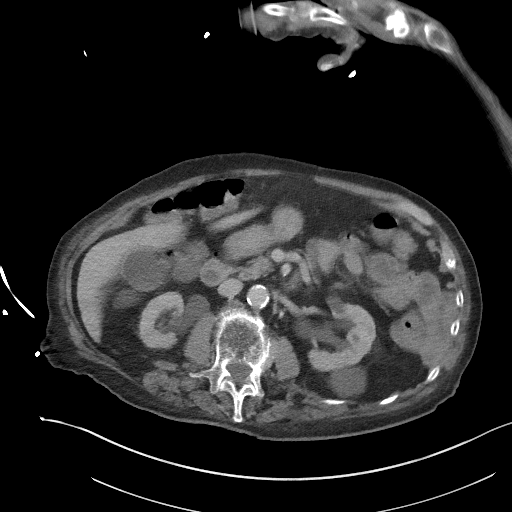
[im 57/85  bone]
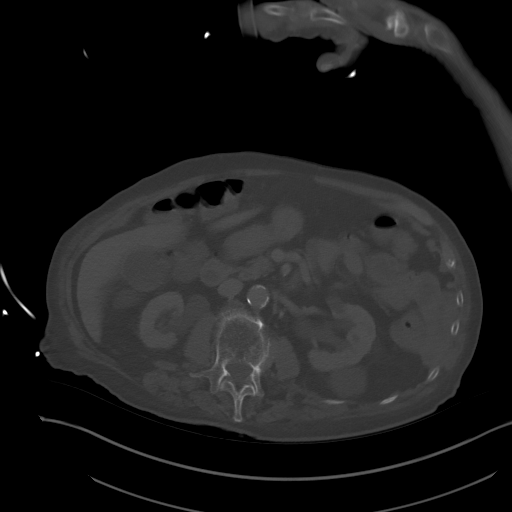
[im 61/85  soft-tissue]
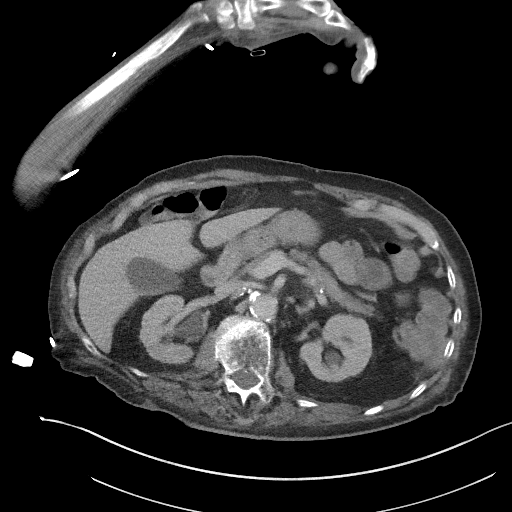
[im 66/85  soft-tissue]
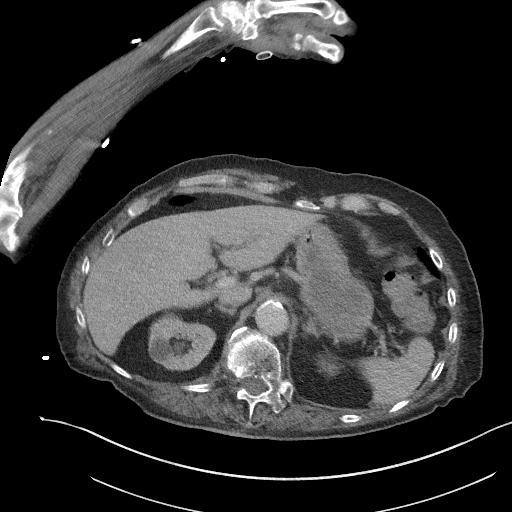
[im 75/85  soft-tissue]
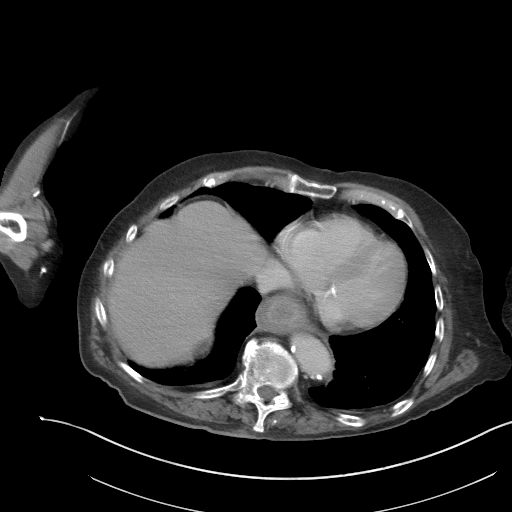
[im 80/85  soft-tissue]
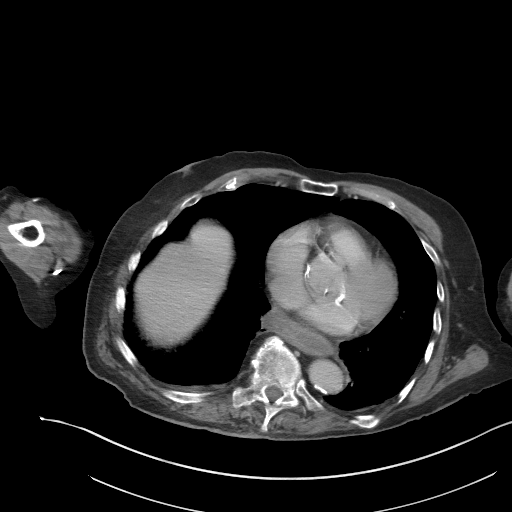

[Series 5: coronal st · coronal · 0.75mm/px · 3 of 75 slices shown]
[im 25/75  soft-tissue]
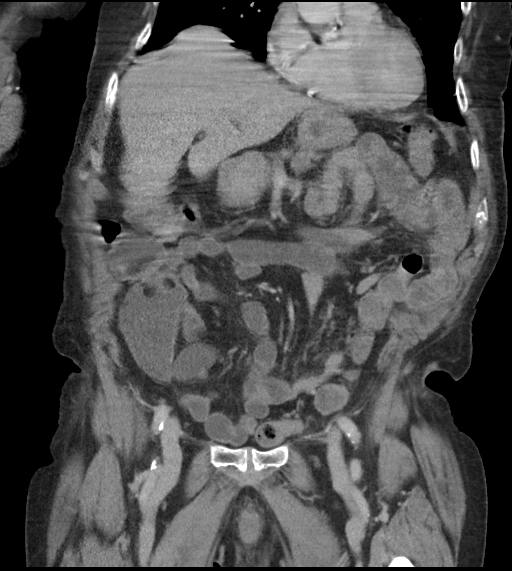
[im 33/75  soft-tissue]
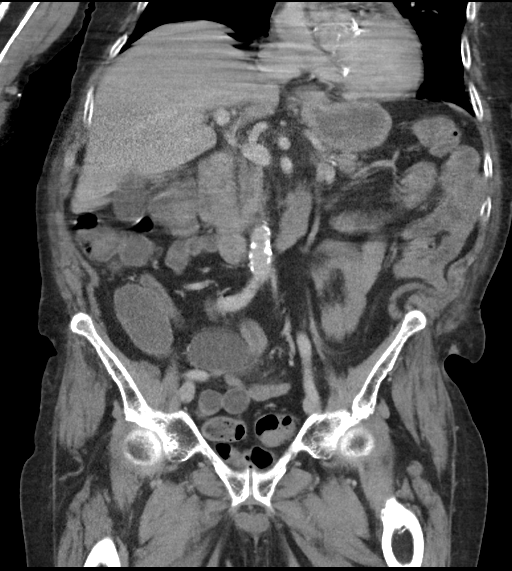
[im 42/75  soft-tissue]
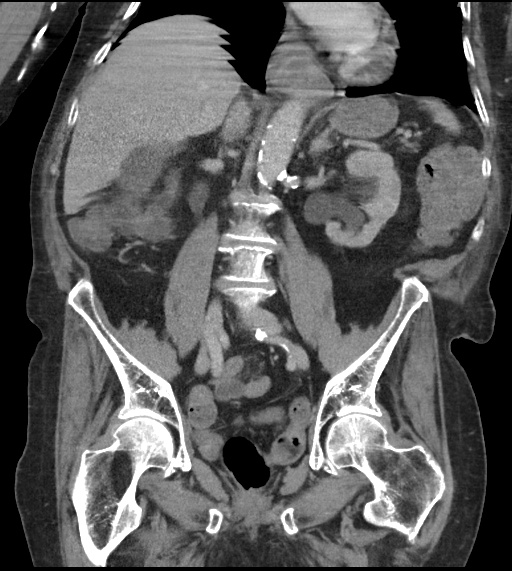

[16 of 46 positions shown; findings below may reference images not displayed]

FINDINGS: Lower chest: There is a moderate hiatal hernia, increased in size to
the prior examination. There is mild luminal enhancement and
possible wall edema. There is lingular atelectasis. No pulmonary
nodules. No visible pleural or pericardial effusion. There is aortic
and coronary artery calcification.

Hepatobiliary: Normal hepatic size and contours without focal liver
lesion. No perihepatic ascites. No intra- or extrahepatic biliary
dilatation. Normal gallbladder.

Pancreas: There are multiple small pancreatic calcifications. The
pancreas is otherwise normal.

Spleen: Normal.

Adrenals/Urinary Tract: The adrenal glands are normal. There is
bilateral lobulated, mildly atrophic appearance of both kidneys.
There are multiple large bilateral renal cysts, measuring up to
centimeters. There is no solid renal mass visualized. The patient is
status post cystectomy with formation of a right lower quadrant
conduit. There is moderate bilateral hydronephrosis and hydroureter.
Delayed phase imaging does not show adequate opacification of the
ureters.

Stomach/Bowel: There is a left inguinal hernia containing a loop of
nondilated sigmoid colon. There is no evidence of small-bowel
obstruction. No evidence of acute inflammation. No fluid collection
within the abdomen.

Vascular/Lymphatic: There is atherosclerosis of the aorta and branch
vessels. No aortic aneurysm. No abdominal or pelvic adenopathy.

Reproductive: Prostate is surgically absent. No pelvic fluid
collection.

Musculoskeletal: Multilevel lumbar facet arthrosis and thoracolumbar
osteophytosis. No focal lytic or blastic osseous lesions. The
visualized extraperitoneal and extrathoracic soft tissues are
normal.

Other: No contributory non-categorized findings.
IMPRESSION: 1. Left inguinal hernia, containing a portion of nondilated sigmoid
colon, new from the prior study.
2. Moderate-sized hiatal hernia with mild luminal enhancement and
possible wall thickening. Correlate for signs of esophagitis.
3. Aortic and coronary artery atherosclerotic calcification.
4. Status post cystectomy with right lower quadrant conduit with
bilateral expected hydronephrosis. Lack of opacification of the
collecting systems on the delayed phase sequences may be due to
timing, versus diminished renal function.
